# Patient Record
Sex: Male | Born: 1945 | Hispanic: No | Marital: Single
Health system: Southern US, Community
[De-identification: ages and names within clinical notes are randomized; demographics above are authoritative.]

## PROBLEM LIST (undated history)

## (undated) DIAGNOSIS — G8250 Quadriplegia, unspecified: Secondary | ICD-10-CM

## (undated) DIAGNOSIS — J181 Lobar pneumonia, unspecified organism: Secondary | ICD-10-CM

## (undated) DIAGNOSIS — J90 Pleural effusion, not elsewhere classified: Secondary | ICD-10-CM

## (undated) DIAGNOSIS — N186 End stage renal disease: Secondary | ICD-10-CM

## (undated) DIAGNOSIS — J80 Acute respiratory distress syndrome: Secondary | ICD-10-CM

## (undated) DIAGNOSIS — Z992 Dependence on renal dialysis: Secondary | ICD-10-CM

## (undated) DIAGNOSIS — D649 Anemia, unspecified: Secondary | ICD-10-CM

## (undated) DIAGNOSIS — J9621 Acute and chronic respiratory failure with hypoxia: Secondary | ICD-10-CM

## (undated) DIAGNOSIS — G40909 Epilepsy, unspecified, not intractable, without status epilepticus: Secondary | ICD-10-CM

## (undated) HISTORY — PX: OTHER SURGICAL HISTORY: SHX169

## (undated) HISTORY — PX: CERVICAL FUSION: SHX112

---

## 2018-06-06 ENCOUNTER — Inpatient Hospital Stay
Admission: RE | Admit: 2018-06-06 | Discharge: 2018-07-28 | Disposition: E | Payer: Medicare Other | Source: Ambulatory Visit | Attending: Internal Medicine | Admitting: Internal Medicine

## 2018-06-06 ENCOUNTER — Other Ambulatory Visit (HOSPITAL_COMMUNITY): Payer: Medicare Other

## 2018-06-06 DIAGNOSIS — J80 Acute respiratory distress syndrome: Secondary | ICD-10-CM

## 2018-06-06 DIAGNOSIS — Z0189 Encounter for other specified special examinations: Secondary | ICD-10-CM

## 2018-06-06 DIAGNOSIS — J189 Pneumonia, unspecified organism: Secondary | ICD-10-CM

## 2018-06-06 DIAGNOSIS — G8250 Quadriplegia, unspecified: Secondary | ICD-10-CM

## 2018-06-06 DIAGNOSIS — N186 End stage renal disease: Secondary | ICD-10-CM

## 2018-06-06 DIAGNOSIS — Z992 Dependence on renal dialysis: Secondary | ICD-10-CM

## 2018-06-06 DIAGNOSIS — R1310 Dysphagia, unspecified: Secondary | ICD-10-CM

## 2018-06-06 DIAGNOSIS — G40909 Epilepsy, unspecified, not intractable, without status epilepticus: Secondary | ICD-10-CM

## 2018-06-06 DIAGNOSIS — J90 Pleural effusion, not elsewhere classified: Secondary | ICD-10-CM

## 2018-06-06 DIAGNOSIS — R0902 Hypoxemia: Secondary | ICD-10-CM

## 2018-06-06 DIAGNOSIS — Z431 Encounter for attention to gastrostomy: Secondary | ICD-10-CM

## 2018-06-06 DIAGNOSIS — Z452 Encounter for adjustment and management of vascular access device: Secondary | ICD-10-CM

## 2018-06-06 DIAGNOSIS — J9690 Respiratory failure, unspecified, unspecified whether with hypoxia or hypercapnia: Secondary | ICD-10-CM

## 2018-06-06 DIAGNOSIS — R509 Fever, unspecified: Secondary | ICD-10-CM

## 2018-06-06 DIAGNOSIS — Z9889 Other specified postprocedural states: Secondary | ICD-10-CM

## 2018-06-06 DIAGNOSIS — J9621 Acute and chronic respiratory failure with hypoxia: Secondary | ICD-10-CM

## 2018-06-06 HISTORY — DX: End stage renal disease: Z99.2

## 2018-06-06 HISTORY — DX: Anemia, unspecified: D64.9

## 2018-06-06 HISTORY — DX: Acute and chronic respiratory failure with hypoxia: J96.21

## 2018-06-06 HISTORY — DX: Pleural effusion, not elsewhere classified: J90

## 2018-06-06 HISTORY — DX: Lobar pneumonia, unspecified organism: J18.1

## 2018-06-06 HISTORY — DX: Quadriplegia, unspecified: G82.50

## 2018-06-06 HISTORY — DX: End stage renal disease: N18.6

## 2018-06-06 HISTORY — DX: Epilepsy, unspecified, not intractable, without status epilepticus: G40.909

## 2018-06-06 HISTORY — DX: Acute respiratory distress syndrome: J80

## 2018-06-06 LAB — URINALYSIS, ROUTINE W REFLEX MICROSCOPIC
Bilirubin Urine: NEGATIVE
Glucose, UA: 50 mg/dL — AB
Ketones, ur: NEGATIVE mg/dL
Nitrite: NEGATIVE
Protein, ur: 100 mg/dL — AB
RBC / HPF: >50 RBC/hpf — ABNORMAL HIGH (ref 0–5)
SPECIFIC GRAVITY, URINE: 1.02 (ref 1.005–1.030)
pH: 5 (ref 5.0–8.0)

## 2018-06-06 LAB — BLOOD GAS, ARTERIAL
ACID-BASE EXCESS: 3.1 mmol/L — AB (ref 0.0–2.0)
Bicarbonate: 27.8 mmol/L (ref 20.0–28.0)
FIO2: 40
O2 Saturation: 98.8 %
PEEP: 8 cmH2O
PH ART: 7.365 (ref 7.350–7.450)
Patient temperature: 100.6
RATE: 12 resp/min
pCO2 arterial: 50.6 mmHg — ABNORMAL HIGH (ref 32.0–48.0)
pO2, Arterial: 158 mmHg — ABNORMAL HIGH (ref 83.0–108.0)

## 2018-06-06 LAB — C DIFFICILE QUICK SCREEN W PCR REFLEX
C DIFFICILE (CDIFF) INTERP: NOT DETECTED
C DIFFICILE (CDIFF) TOXIN: NEGATIVE
C Diff antigen: NEGATIVE

## 2018-06-07 LAB — COMPREHENSIVE METABOLIC PANEL
ALBUMIN: 3.1 g/dL — AB (ref 3.5–5.0)
ALT: 40 U/L (ref 0–44)
ANION GAP: 19 — AB (ref 5–15)
AST: 44 U/L — AB (ref 15–41)
Alkaline Phosphatase: 112 U/L (ref 38–126)
BUN: 77 mg/dL — AB (ref 8–23)
CHLORIDE: 90 mmol/L — AB (ref 98–111)
CO2: 25 mmol/L (ref 22–32)
Calcium: 8.6 mg/dL — ABNORMAL LOW (ref 8.9–10.3)
Creatinine, Ser: 5.97 mg/dL — ABNORMAL HIGH (ref 0.61–1.24)
GFR calc Af Amer: 10 mL/min — ABNORMAL LOW (ref >60)
GFR, EST NON AFRICAN AMERICAN: 8 mL/min — AB (ref >60)
GLUCOSE: 171 mg/dL — AB (ref 70–99)
POTASSIUM: 3.7 mmol/L (ref 3.5–5.1)
Sodium: 134 mmol/L — ABNORMAL LOW (ref 135–145)
Total Bilirubin: 0.7 mg/dL (ref 0.3–1.2)
Total Protein: 6.2 g/dL — ABNORMAL LOW (ref 6.5–8.1)

## 2018-06-07 LAB — CBC WITH DIFFERENTIAL/PLATELET
ABS IMMATURE GRANULOCYTES: 0.3 10*3/uL — AB (ref 0.0–0.1)
BASOS ABS: 0 10*3/uL (ref 0.0–0.1)
Basophils Relative: 0 %
EOS PCT: 1 %
Eosinophils Absolute: 0.1 10*3/uL (ref 0.0–0.7)
HCT: 25 % — ABNORMAL LOW (ref 39.0–52.0)
Hemoglobin: 8 g/dL — ABNORMAL LOW (ref 13.0–17.0)
Immature Granulocytes: 2 %
Lymphocytes Relative: 10 %
Lymphs Abs: 1.8 10*3/uL (ref 0.7–4.0)
MCH: 30.2 pg (ref 26.0–34.0)
MCHC: 32 g/dL (ref 30.0–36.0)
MCV: 94.3 fL (ref 78.0–100.0)
MONO ABS: 1.9 10*3/uL — AB (ref 0.1–1.0)
Monocytes Relative: 11 %
NEUTROS ABS: 13.6 10*3/uL — AB (ref 1.7–7.7)
Neutrophils Relative %: 76 %
Platelets: 240 10*3/uL (ref 150–400)
RBC: 2.65 MIL/uL — ABNORMAL LOW (ref 4.22–5.81)
RDW: 15 % (ref 11.5–15.5)
WBC: 17.7 10*3/uL — ABNORMAL HIGH (ref 4.0–10.5)

## 2018-06-07 LAB — PHOSPHORUS: Phosphorus: 4.5 mg/dL (ref 2.5–4.6)

## 2018-06-07 LAB — TSH: TSH: 1.753 u[IU]/mL (ref 0.350–4.500)

## 2018-06-07 LAB — PROTIME-INR
INR: 1.11
PROTHROMBIN TIME: 14.2 s (ref 11.4–15.2)

## 2018-06-07 LAB — MAGNESIUM: MAGNESIUM: 2.4 mg/dL (ref 1.7–2.4)

## 2018-06-07 LAB — HEMOGLOBIN A1C
Hgb A1c MFr Bld: 6.7 % — ABNORMAL HIGH (ref 4.8–5.6)
MEAN PLASMA GLUCOSE: 145.59 mg/dL

## 2018-06-07 MED ORDER — IPRATROPIUM BROMIDE HFA 17 MCG/ACT IN AERS
10.00 | INHALATION_SPRAY | RESPIRATORY_TRACT | Status: DC
Start: 2018-06-06 — End: 2018-06-07

## 2018-06-07 MED ORDER — HEPARIN SODIUM (PORCINE) 1000 UNIT/ML IJ SOLN
1600.00 | INTRAMUSCULAR | Status: DC
Start: ? — End: 2018-06-07

## 2018-06-07 MED ORDER — LEVETIRACETAM 500 MG PO TABS
500.00 | ORAL_TABLET | ORAL | Status: DC
Start: 2018-06-07 — End: 2018-06-07

## 2018-06-07 MED ORDER — ALBUTEROL SULFATE HFA 108 (90 BASE) MCG/ACT IN AERS
10.00 | INHALATION_SPRAY | RESPIRATORY_TRACT | Status: DC
Start: 2018-06-06 — End: 2018-06-07

## 2018-06-07 MED ORDER — LEVETIRACETAM 250 MG PO TABS
250.00 | ORAL_TABLET | ORAL | Status: DC
Start: 2018-06-06 — End: 2018-06-07

## 2018-06-07 MED ORDER — FAMOTIDINE 20 MG PO TABS
20.00 | ORAL_TABLET | ORAL | Status: DC
Start: 2018-06-06 — End: 2018-06-07

## 2018-06-07 MED ORDER — GLUCOSE 40 % PO GEL
ORAL | Status: DC
Start: ? — End: 2018-06-07

## 2018-06-07 MED ORDER — ATORVASTATIN CALCIUM 40 MG PO TABS
40.00 | ORAL_TABLET | ORAL | Status: DC
Start: 2018-06-06 — End: 2018-06-07

## 2018-06-07 MED ORDER — OXYCODONE HCL 5 MG PO TABS
5.00 | ORAL_TABLET | ORAL | Status: DC
Start: ? — End: 2018-06-07

## 2018-06-07 MED ORDER — POLYETHYLENE GLYCOL 3350 17 G PO PACK
17.00 | PACK | ORAL | Status: DC
Start: ? — End: 2018-06-07

## 2018-06-07 MED ORDER — ONDANSETRON HCL 4 MG/2ML IJ SOLN
4.00 | INTRAMUSCULAR | Status: DC
Start: ? — End: 2018-06-07

## 2018-06-07 MED ORDER — MIDODRINE HCL 5 MG PO TABS
10.00 | ORAL_TABLET | ORAL | Status: DC
Start: 2018-06-06 — End: 2018-06-07

## 2018-06-07 MED ORDER — ENOXAPARIN SODIUM 30 MG/0.3ML ~~LOC~~ SOLN
30.00 | SUBCUTANEOUS | Status: DC
Start: 2018-06-07 — End: 2018-06-07

## 2018-06-07 MED ORDER — ACETAMINOPHEN 325 MG PO TABS
650.00 | ORAL_TABLET | ORAL | Status: DC
Start: ? — End: 2018-06-07

## 2018-06-07 MED ORDER — DEXTROSE 50 % IV SOLN
25.00 | INTRAVENOUS | Status: DC
Start: ? — End: 2018-06-07

## 2018-06-07 MED ORDER — SEVELAMER CARBONATE 2.4 G PO PACK
2400.00 | PACK | ORAL | Status: DC
Start: 2018-06-06 — End: 2018-06-07

## 2018-06-07 MED ORDER — PROPOFOL 100 MG/10ML IV EMUL
0.00 | INTRAVENOUS | Status: DC
Start: ? — End: 2018-06-07

## 2018-06-07 MED ORDER — HYDRALAZINE HCL 20 MG/ML IJ SOLN
10.00 | INTRAMUSCULAR | Status: DC
Start: ? — End: 2018-06-07

## 2018-06-07 MED ORDER — EPOETIN ALFA 10000 UNIT/ML IJ SOLN
10000.00 | INTRAMUSCULAR | Status: DC
Start: ? — End: 2018-06-07

## 2018-06-07 MED ORDER — GLUCAGON HCL (DIAGNOSTIC) 1 MG IJ SOLR
1.00 | INTRAMUSCULAR | Status: DC
Start: ? — End: 2018-06-07

## 2018-06-07 MED ORDER — INSULIN LISPRO 100 UNIT/ML (KWIKPEN)
0.00 | PEN_INJECTOR | SUBCUTANEOUS | Status: DC
Start: 2018-06-06 — End: 2018-06-07

## 2018-06-07 NOTE — Consult Note (Signed)
[facility] CONSULT NOTE    Date: 06/07/2018                  Patient Name:  Alec Bush.  MRN: [id number]  DOB: 1946-03-26  Age / Sex: 72 y.o., male         PCP: Carron Curie, MD                 Service Requesting Consult: Hospitalist                 Reason for Consult: Evaluation and management of ESRD            History of Present Illness: Patient is a 72 y.o. male with a PMHx of ESRD on HD MWF prior to admission here, seizure disorder, hypertension, anemia chronic kidney disease, secondary hyperparathyroidism, who was admitted to [facility] on 2018-06-24 for ongoing treatment of acute respiratory failure, paraplegia with minimal right arm movement, end-stage renal disease.  Prior to admission at his last hospitalization at the outside hospital patient suffered a fall.  He was found down on the floor.  Ultimately an MRI of the spine was performed and he was found to have an acute cervical disc herniation.  He underwent C3-6 ACDF for cord compression, edema, and weakness.  He had resultant quadriplegia with some very mild ability to move his right upper extremity.  Trial of extubation was performed however he was subsequently reintubated.  He then also developed aspiration pneumonia.  His dialysis was continued during his inpatient hospitalization.  He has a right internal jugular PermCath in place.  Patient currently unable to provide any history at this time.   Medications upon discharge from last hospitalization: acetaminophen 325 MG tablet Commonly known as: TYLENOL Take 2 tablets (650 mg total) by mouth every four (4) hours as needed.  albuterol 90 mcg/actuation inhaler Commonly known as: PROVENTIL HFA;VENTOLIN HFA Inhale 10 puffs every 4 (four) hours.  dextrose 40 % gel Commonly known as: GLUTOSE Take 37.5 g (15 g of dextrose total) by mouth every thirty (30) minutes as needed.  dextrose 50 % in water (D50W) Syrg Infuse 50 mL (25 g total)  into a venous catheter every fifteen (15) minutes as needed.  enoxaparin 30 mg/0.3 mL Syrg Commonly known as: LOVENOX Inject 0.3 mL (30 mg total) under the skin daily. Start taking on: 06/07/2018  famotidine 20 MG tablet Commonly known as: PEPCID 1 tablet (20 mg total) by G-tube route daily.  insulin lispro 100 unit/mL injection pen Commonly known as: HumaLOG Inject 0-15 Units under the skin Every six (6) hours.  ipratropium 17 mcg/actuation inhaler Commonly known as: ATROVENT HFA Inhale 10 puffs every 4 (four) hours.  midodrine 10 MG tablet Commonly known as: PROAMATINE Take 1 tablet (10 mg total) by mouth Three (3) times a day.  ondansetron 4 mg/2 mL injection Commonly known as: ZOFRAN Infuse 2 mL (4 mg total) into a venous catheter every six (6) hours as needed.  oxyCODONE 5 MG immediate release tablet Commonly known as: ROXICODONE Take 1 tablet (5 mg total) by mouth every six (6) hours as needed. for up to 5 days  polyethylene glycol 17 gram packet commonly known as: MIRALAX Take 17 g by mouth daily as needed.  propofol infusion Commonly known as: DIPRIVAN Infuse 0-5,400 mcg/min into a venous catheter continuous.  atorvastatin 40 MG tablet Commonly known as: LIPITOR Take 40 mg by mouth nightly.  levETIRAcetam 250 MG tablet Commonly known as:  KEPPRA Take 500 mg by mouth every morning.  levETIRAcetam 250 MG tablet Commonly known as: KEPPRA Take 250 mg by mouth every evening.  sevelamer carbonate 2.4 gram Pwpk Commonly known as: RENVELA Take 1 packet by mouth Three (3) times a day with a meal.  sodium bicarbonate 650 mg tablet Take 650 mg by mouth Two (2) times a day.   Allergies: No known drug allergies   Past Medical History: ESRD on HD C5-C6 disc herniation status post ACDF Acute respiratory failure Seizure disorder Anemia chronic kidney disease Metabolic acidosis Secondary hyperparathyroidism   Past Surgical History: C5-6 disc herniation  status post ACDF  Family History: Unable to obtain from the patient has is currently on a ventilator.  Social History: Unable to obtain from the patient is currently on the ventilator.  Review of Systems: Unable to obtain from the patient is currently on a ventilator.  Vital Signs: 97.6 pulse 68 respirations 22 blood pressure 159/68  Weight trends: There were no vitals filed for this visit.  Physical Exam: General: Critically ill appearing  Head: Normocephalic, atraumatic.  Eyes: Anicteric  Nose: Mucous membranes moist, not inflammed, nonerythematous.  Throat: ETT in place  Neck: C-collar in place  Lungs:  Scattered rhonchi, vent assisted  Heart: S1S2 no rubs  Abdomen:  BS normoactive. Soft, Nondistended, non-tender.    Extremities: trace pretibial edema.  Neurologic: Currently intubated  Skin: No visible rashes, scars.    Lab results: Basic Metabolic Panel: Recent Labs  Lab 06/07/18 0415  NA 134*  K 3.7  CL 90*  CO2 25  GLUCOSE 171*  BUN 77*  CREATININE 5.97*  CALCIUM 8.6*  MG 2.4  PHOS 4.5    Liver Function Tests: Recent Labs  Lab 06/07/18 0415  AST 44*  ALT 40  ALKPHOS 112  BILITOT 0.7  PROT 6.2*  ALBUMIN 3.1*   No results for input(s): LIPASE, AMYLASE in the last 168 hours. No results for input(s): AMMONIA in the last 168 hours.  CBC: Recent Labs  Lab 06/07/18 0415  WBC 17.7*  NEUTROABS 13.6*  HGB 8.0*  HCT 25.0*  MCV 94.3  PLT 240    Cardiac Enzymes: No results for input(s): CKTOTAL, CKMB, CKMBINDEX, TROPONINI in the last 168 hours.  BNP: Invalid input(s): POCBNP  CBG: No results for input(s): GLUCAP in the last 168 hours.  Microbiology: Results for orders placed or performed during the hospital encounter of October 27, 2018  Culture, respiratory (NON-Expectorated)     Status: None (Preliminary result)   Collection Time: October 27, 2018  4:00 PM  Result Value Ref Range Status   Specimen Description TRACHEAL ASPIRATE  Final   Special  Requests NONE  Final   Gram Stain   Final    MODERATE WBC PRESENT,BOTH PMN AND MONONUCLEAR RARE GRAM POSITIVE COCCI    Culture   Final    CULTURE REINCUBATED FOR BETTER GROWTH Performed at [facility], [address] [place], [place] [address]    Report Status PENDING  Incomplete  C Difficile Quick Screen w PCR reflex     Status: None   Collection Time: October 27, 2018  6:10 PM  Result Value Ref Range Status   C Diff antigen NEGATIVE NEGATIVE Final   C Diff toxin NEGATIVE NEGATIVE Final   C Diff interpretation No C. difficile detected.  Final    Coagulation Studies: Recent Labs    06/07/18 0415  LABPROT 14.2  INR 1.11    Urinalysis: Recent Labs    October 27, 2018 1800  COLORURINE  AMBER*  LABSPEC 1.020  PHURINE 5.0  GLUCOSEU 50*  HGBUR LARGE*  BILIRUBINUR NEGATIVE  KETONESUR NEGATIVE  PROTEINUR 100*  NITRITE NEGATIVE  LEUKOCYTESUR SMALL*      Imaging: Dg Chest Port 1 View  Result Date: July 06, 2018 CLINICAL DATA:  72 year old male with respiratory failure. EXAM: PORTABLE CHEST 1 VIEW COMPARISON:  None. FINDINGS: Portable AP semi upright view at 1558 hours. Endotracheal tube tip projects over the tracheal air column between the level of the clavicles and carina. Enteric tube courses to the abdomen, side hole projects at the gastric body level. There is a tunneled type right IJ approach dual lumen dialysis type catheter in place. Mediastinal contours are within normal limits. The left lung appears clear. There is veiling opacity about the right hilum and lung base. No pneumothorax. Normal pulmonary vascularity. Paucity of bowel gas in the upper abdomen. IMPRESSION: 1. Lines and tubes in place as above. 2. Veiling opacity about the right hilum and lung base could represent a combination of: Atelectasis, pleural effusion, consolidation. Electronically Signed   By: Odessa Fleming M.D.   On: 06-Jul-2018 16:12   Dg Abd Portable 1v  Result Date: 06-Jul-2018 CLINICAL DATA:  Orogastric tube  placement. EXAM: PORTABLE ABDOMEN - 1 VIEW COMPARISON:  None. FINDINGS: The bowel gas pattern is normal. Distal tip of orogastric tube is seen in expected position of proximal stomach. No radio-opaque calculi or other significant radiographic abnormality are seen. IMPRESSION: Distal tip of orogastric tube tip is seen in expected position of proximal stomach. Electronically Signed   By: Lupita Raider, M.D.   On: July 06, 2018 16:12      Assessment & Plan: Pt is a 72 y.o. male with a PMHx of ESRD on HD MWF prior to admission here, seizure disorder, hypertension, anemia chronic kidney disease, secondary hyperparathyroidism, who was admitted to [facility] on 07-06-2018 for ongoing treatment of acute respiratory failure, paraplegia with minimal right arm movement, end-stage renal disease.   1.  ESRD with right internal jugular PermCath.  We will plan to perform hemodialysis tomorrow using his right internal jugular PermCath.  Avoid heparin at this time given recent C-spine surgery.    2. Anemia chronic kidney disease.  Hemoglobin currently 8.0.  Consider adding Epogen to his medication regimen next week.  3.  Secondary hyperparathyroidism.  We will monitor serum calcium, phosphorus, and PTH during the course of this hospitalization.  4.  Acute respiratory failure.  Patient still has endotracheal tube in place.  Tracheostomy will likely be considered this hospital admission.  5.  Thanks for consultation.  Further plan as patient progresses.

## 2018-06-08 LAB — URINE CULTURE: Culture: NO GROWTH

## 2018-06-08 LAB — CBC
HCT: 24.3 % — ABNORMAL LOW (ref 39.0–52.0)
Hemoglobin: 8.1 g/dL — ABNORMAL LOW (ref 13.0–17.0)
MCH: 30.9 pg (ref 26.0–34.0)
MCHC: 33.3 g/dL (ref 30.0–36.0)
MCV: 92.7 fL (ref 78.0–100.0)
Platelets: 248 10*3/uL (ref 150–400)
RBC: 2.62 MIL/uL — ABNORMAL LOW (ref 4.22–5.81)
RDW: 14.9 % (ref 11.5–15.5)
WBC: 14.8 10*3/uL — ABNORMAL HIGH (ref 4.0–10.5)

## 2018-06-08 LAB — RENAL FUNCTION PANEL
ANION GAP: 17 — AB (ref 5–15)
Albumin: 2.9 g/dL — ABNORMAL LOW (ref 3.5–5.0)
BUN: 103 mg/dL — ABNORMAL HIGH (ref 8–23)
CALCIUM: 8.9 mg/dL (ref 8.9–10.3)
CHLORIDE: 88 mmol/L — AB (ref 98–111)
CO2: 26 mmol/L (ref 22–32)
Creatinine, Ser: 6.96 mg/dL — ABNORMAL HIGH (ref 0.61–1.24)
GFR calc non Af Amer: 7 mL/min — ABNORMAL LOW (ref >60)
GFR, EST AFRICAN AMERICAN: 8 mL/min — AB (ref >60)
Glucose, Bld: 197 mg/dL — ABNORMAL HIGH (ref 70–99)
POTASSIUM: 3.7 mmol/L (ref 3.5–5.1)
Phosphorus: 5.5 mg/dL — ABNORMAL HIGH (ref 2.5–4.6)
SODIUM: 131 mmol/L — AB (ref 135–145)

## 2018-06-08 LAB — CULTURE, RESPIRATORY W GRAM STAIN: Culture: NORMAL

## 2018-06-08 LAB — CULTURE, RESPIRATORY

## 2018-06-08 LAB — MAGNESIUM: MAGNESIUM: 2.7 mg/dL — AB (ref 1.7–2.4)

## 2018-06-09 ENCOUNTER — Encounter: Payer: Self-pay | Admitting: Internal Medicine

## 2018-06-09 DIAGNOSIS — G8250 Quadriplegia, unspecified: Secondary | ICD-10-CM | POA: Diagnosis not present

## 2018-06-09 DIAGNOSIS — J9621 Acute and chronic respiratory failure with hypoxia: Secondary | ICD-10-CM

## 2018-06-09 DIAGNOSIS — G40909 Epilepsy, unspecified, not intractable, without status epilepticus: Secondary | ICD-10-CM

## 2018-06-09 DIAGNOSIS — N186 End stage renal disease: Secondary | ICD-10-CM

## 2018-06-09 DIAGNOSIS — Z992 Dependence on renal dialysis: Secondary | ICD-10-CM

## 2018-06-09 HISTORY — DX: End stage renal disease: N18.6

## 2018-06-09 HISTORY — DX: Acute and chronic respiratory failure with hypoxia: J96.21

## 2018-06-09 HISTORY — DX: Epilepsy, unspecified, not intractable, without status epilepticus: G40.909

## 2018-06-09 HISTORY — DX: End stage renal disease: Z99.2

## 2018-06-09 HISTORY — DX: Quadriplegia, unspecified: G82.50

## 2018-06-09 NOTE — Consult Note (Signed)
Pulmonary Critical Care [facility]ULMONARY SERVICE  Date of Service: 06/09/2018  PULMONARY CONSULT   Alec Hammedichard Gangemi Jr.  [id number]  DOB: 11-22-1946   DOA: 12-30-2017  Referring Physician: Carron CurieAli Hijazi, MD  HPI: Alec HammedRichard Polasek Jr. is a 72 y.o. male seen for follow up of Acute on Chronic Respiratory Failure.  Patient has multiple medical problems can presented to our facility for further weaning.  Currently is on pressure support the goal is about 8 hours.  The patient has a history of end-stage renal disease on dialysis.  Has a also a history of seizure disorder hypertension and chronic anemia.  Patient presented to the hospital because of a fall.  Patient was down on the floor and MRI was done which showed a C3 C3-6 cord compression and he underwent anterior cervical fusion for that.  The patient subsequently has been quadriplegic and not really able to move his extremities.  He was extubated at the other facility and failed.  Since the patient is orally intubated he was transferred to our facility for further weaning and management.  Review of Systems:  ROS performed and is unremarkable other than noted above.  Past Medical History:  Diagnosis Date  . Acute on chronic respiratory failure with hypoxia (HCC) 06/09/2018  . Chronic anemia   . End stage renal disease on dialysis (HCC) 06/09/2018  . End stage renal failure on dialysis (HCC)   . Quadriplegia (HCC) 06/09/2018  . Seizure disorder (HCC) 06/09/2018    Past Surgical History:  Procedure Laterality Date  . ACDF    . CERVICAL FUSION      Social History:    has an unknown smoking status. He has never used smokeless tobacco. He reports that he drank alcohol. He reports that he has current or past drug history.  Family History: Non-Contributory to the present illness  Allergies reviewed  Medications: Reviewed on Rounds  Physical Exam:  Vitals: Temperature 97.2 pulse 59 respiratory rate 20  blood pressure 160/73 saturations 100%  Ventilator Settings mode of ventilation pressure support FiO2 28% tidal volume 548 pressure support 12 PEEP 5  . General: Comfortable at this time . Eyes: Grossly normal lids, irises & conjunctiva . ENT: grossly tongue is normal . Neck: no obvious mass . Cardiovascular: S1-S2 normal no gallop or rub . Respiratory: Scattered rhonchi noted bilaterally . Abdomen: Soft nontender . Skin: no rash seen on limited exam . Musculoskeletal: not rigid . Psychiatric:unable to assess . Neurologic: no seizure no involuntary movements         Labs on Admission:  Basic Metabolic Panel: Recent Labs  Lab 06/07/18 0415 06/08/18 0622  NA 134* 131*  K 3.7 3.7  CL 90* 88*  CO2 25 26  GLUCOSE 171* 197*  BUN 77* 103*  CREATININE 5.97* 6.96*  CALCIUM 8.6* 8.9  MG 2.4 2.7*  PHOS 4.5 5.5*    Liver Function Tests: Recent Labs  Lab 06/07/18 0415 06/08/18 0622  AST 44*  --   ALT 40  --   ALKPHOS 112  --   BILITOT 0.7  --   PROT 6.2*  --   ALBUMIN 3.1* 2.9*   No results for input(s): LIPASE, AMYLASE in the last 168 hours. No results for input(s): AMMONIA in the last 168 hours.  CBC: Recent Labs  Lab 06/07/18 0415 06/08/18 0622  WBC 17.7* 14.8*  NEUTROABS 13.6*  --   HGB 8.0* 8.1*  HCT 25.0* 24.3*  MCV 94.3 92.7  PLT  240 248    Cardiac Enzymes: No results for input(s): CKTOTAL, CKMB, CKMBINDEX, TROPONINI in the last 168 hours.  BNP (last 3 results) No results for input(s): BNP in the last 8760 hours.  ProBNP (last 3 results) No results for input(s): PROBNP in the last 8760 hours.   Radiological Exams on Admission: Dg Chest Port 1 View  Result Date: 06/09/2018 CLINICAL DATA:  72 year old male with respiratory failure. EXAM: PORTABLE CHEST 1 VIEW COMPARISON:  None. FINDINGS: Portable AP semi upright view at 1558 hours. Endotracheal tube tip projects over the tracheal air column between the level of the clavicles and carina. Enteric tube  courses to the abdomen, side hole projects at the gastric body level. There is a tunneled type right IJ approach dual lumen dialysis type catheter in place. Mediastinal contours are within normal limits. The left lung appears clear. There is veiling opacity about the right hilum and lung base. No pneumothorax. Normal pulmonary vascularity. Paucity of bowel gas in the upper abdomen. IMPRESSION: 1. Lines and tubes in place as above. 2. Veiling opacity about the right hilum and lung base could represent a combination of: Atelectasis, pleural effusion, consolidation. Electronically Signed   By: Odessa Fleming M.D.   On: 06/04/2018 16:12   Dg Abd Portable 1v  Result Date: 06/13/2018 CLINICAL DATA:  Orogastric tube placement. EXAM: PORTABLE ABDOMEN - 1 VIEW COMPARISON:  None. FINDINGS: The bowel gas pattern is normal. Distal tip of orogastric tube is seen in expected position of proximal stomach. No radio-opaque calculi or other significant radiographic abnormality are seen. IMPRESSION: Distal tip of orogastric tube tip is seen in expected position of proximal stomach. Electronically Signed   By: Lupita Raider, M.D.   On: 06/09/2018 16:12    Assessment/Plan Active Problems:   Acute on chronic respiratory failure with hypoxia (HCC)   End stage renal disease on dialysis (HCC)   Quadriplegia (HCC)   Seizure disorder (HCC)   1. Acute on chronic respiratory failure with hypoxia patient is on a wean protocol weaning currently the goal is for 8 hours on pressure support mode.  So far patient looks like he is doing well.  We will continue with supportive care and follow continue with pulmonary toilet. 2. End-stage renal disease on dialysis.  Followed by nephrology will continue present therapy. 3. Quadriplegia restorative therapy 4. Seizure disorder no active seizures are noted.  I have personally seen and evaluated the patient, evaluated laboratory and imaging results, formulated the assessment and plan and placed  orders. The Patient requires high complexity decision making for assessment and support.  Case was discussed on Rounds with the Respiratory Therapy Staff Time Spent review of the clinical chart as well as discussion with the primary care team and consultants  Yevonne Pax, MD [facility] Pulmonary Critical Care Medicine Sleep Medicine

## 2018-06-10 DIAGNOSIS — G8250 Quadriplegia, unspecified: Secondary | ICD-10-CM | POA: Diagnosis not present

## 2018-06-10 DIAGNOSIS — N186 End stage renal disease: Secondary | ICD-10-CM | POA: Diagnosis not present

## 2018-06-10 DIAGNOSIS — G40909 Epilepsy, unspecified, not intractable, without status epilepticus: Secondary | ICD-10-CM | POA: Diagnosis not present

## 2018-06-10 DIAGNOSIS — J9621 Acute and chronic respiratory failure with hypoxia: Secondary | ICD-10-CM | POA: Diagnosis not present

## 2018-06-10 NOTE — Progress Notes (Signed)
Pulmonary Critical Care [facility]   PULMON[facility] SERVICE  PROGRESS NOTE  Date of Service: 06/10/2018  Carley Hammedichard Bywater Jr.  [id number]  DOB: 04-16-1946   DOA: 2018-07-20  Referring Physician: Carron CurieAli Hijazi, MD  HPI: Carley HammedRichard Moilanen Jr. is a 72 y.o. male seen for follow up of Acute on Chronic Respiratory Failure.  Patient is weaning on pressure support has been tolerating it well.  The goal for today is about 12 hours on pressure support  Medications: Reviewed on Rounds  Physical Exam:  Vitals: Temperature 97.3 pulse 92 respiratory 18 blood pressure 147/67 saturations 92%  Ventilator Settings mode of ventilation pressure support FiO2 28% tidal volume 450 pressure support 12 PEEP 6  . General: Comfortable at this time . Eyes: Grossly normal lids, irises & conjunctiva . ENT: grossly tongue is normal . Neck: no obvious mass . Cardiovascular: S1 S2 normal no gallop . Respiratory: No rhonchi or rales are noted . Abdomen: soft . Skin: no rash seen on limited exam . Musculoskeletal: not rigid . Psychiatric:unable to assess . Neurologic: no seizure no involuntary movements         Lab Data:   Basic Metabolic Panel: Recent Labs  Lab 06/07/18 0415 06/08/18 0622  NA 134* 131*  K 3.7 3.7  CL 90* 88*  CO2 25 26  GLUCOSE 171* 197*  BUN 77* 103*  CREATININE 5.97* 6.96*  CALCIUM 8.6* 8.9  MG 2.4 2.7*  PHOS 4.5 5.5*    Liver Function Tests: Recent Labs  Lab 06/07/18 0415 06/08/18 0622  AST 44*  --   ALT 40  --   ALKPHOS 112  --   BILITOT 0.7  --   PROT 6.2*  --   ALBUMIN 3.1* 2.9*   No results for input(s): LIPASE, AMYLASE in the last 168 hours. No results for input(s): AMMONIA in the last 168 hours.  CBC: Recent Labs  Lab 06/07/18 0415 06/08/18 0622  WBC 17.7* 14.8*  NEUTROABS 13.6*  --   HGB 8.0* 8.1*  HCT 25.0* 24.3*  MCV 94.3 92.7  PLT 240 248    Cardiac Enzymes: No results for input(s): CKTOTAL, CKMB, CKMBINDEX, TROPONINI  in the last 168 hours.  BNP (last 3 results) No results for input(s): BNP in the last 8760 hours.  ProBNP (last 3 results) No results for input(s): PROBNP in the last 8760 hours.  Radiological Exams: No results found.  Assessment/Plan Active Problems:   Acute on chronic respiratory failure with hypoxia (HCC)   End stage renal disease on dialysis (HCC)   Quadriplegia (HCC)   Seizure disorder (HCC)   1. Acute on chronic respiratory failure with hypoxia we will continue weaning on pressure support continue pulmonary toilet secretion management 2. End-stage renal disease on dialysis followed by nephrology will continue with dialysis 3. Quadriplegia grossly unchanged continue with therapy as tolerated 4. Seizure no active seizures are noted at this time.   I have personally seen and evaluated the patient, evaluated laboratory and imaging results, formulated the assessment and plan and placed orders. The Patient requires high complexity decision making for assessment and support.  Case was discussed on Rounds with the Respiratory Therapy Staff  Yevonne PaxSaadat A Zerek Litsey, MD [facility] Pulmonary Critical Care Medicine Sleep Medicine

## 2018-06-10 NOTE — Progress Notes (Signed)
[facility]  ROUNDING NOTE   Subjective:  Patient remains on the ventilator and remains critically ill. Patient will be due for dialysis again tomorrow.   Objective:  Vital signs in last 24 hours:  Temperature 97.3 pulse 82 respirations 18 blood pressure 147/67 Physical Exam: General: Critically ill appearing  Head: ETT in place  Eyes: Anicteric  Neck: C colloar on  Lungs:  Clear to auscultation, vent assisted  Heart: S1S2 no rubs  Abdomen:  Soft, nontender, bowel sounds present  Extremities: Trace peripheral edema.  Neurologic: Intubated, on the vent  Skin: No lesions  Access: R IJ permcath    Basic Metabolic Panel: Recent Labs  Lab 06/07/18 0415 06/08/18 0622  NA 134* 131*  K 3.7 3.7  CL 90* 88*  CO2 25 26  GLUCOSE 171* 197*  BUN 77* 103*  CREATININE 5.97* 6.96*  CALCIUM 8.6* 8.9  MG 2.4 2.7*  PHOS 4.5 5.5*    Liver Function Tests: Recent Labs  Lab 06/07/18 0415 06/08/18 0622  AST 44*  --   ALT 40  --   ALKPHOS 112  --   BILITOT 0.7  --   PROT 6.2*  --   ALBUMIN 3.1* 2.9*   No results for input(s): LIPASE, AMYLASE in the last 168 hours. No results for input(s): AMMONIA in the last 168 hours.  CBC: Recent Labs  Lab 06/07/18 0415 06/08/18 0622  WBC 17.7* 14.8*  NEUTROABS 13.6*  --   HGB 8.0* 8.1*  HCT 25.0* 24.3*  MCV 94.3 92.7  PLT 240 248    Cardiac Enzymes: No results for input(s): CKTOTAL, CKMB, CKMBINDEX, TROPONINI in the last 168 hours.  BNP: Invalid input(s): POCBNP  CBG: No results for input(s): GLUCAP in the last 168 hours.  Microbiology: Results for orders placed or performed during the hospital encounter of 06/19/2018  Culture, respiratory (NON-Expectorated)     Status: None   Collection Time: 06/14/2018  4:00 PM  Result Value Ref Range Status   Specimen Description TRACHEAL ASPIRATE  Final   Special Requests NONE  Final   Gram Stain   Final    MODERATE WBC PRESENT,BOTH PMN AND MONONUCLEAR RARE GRAM POSITIVE  COCCI    Culture   Final    Consistent with normal respiratory flora. Performed at [facility], [address] [place], [place] [address]    Report Status 06/08/2018 FINAL  Final  Culture, Urine     Status: None   Collection Time: 06/03/2018  6:02 PM  Result Value Ref Range Status   Specimen Description URINE, RANDOM  Final   Special Requests NONE  Final   Culture   Final    NO GROWTH Performed at [facility], [address] [place], [place] [address]    Report Status 06/08/2018 FINAL  Final  C Difficile Quick Screen w PCR reflex     Status: None   Collection Time: 06/20/2018  6:10 PM  Result Value Ref Range Status   C Diff antigen NEGATIVE NEGATIVE Final   C Diff toxin NEGATIVE NEGATIVE Final   C Diff interpretation No C. difficile detected.  Final    Coagulation Studies: No results for input(s): LABPROT, INR in the last 72 hours.  Urinalysis: No results for input(s): COLORURINE, LABSPEC, PHURINE, GLUCOSEU, HGBUR, BILIRUBINUR, KETONESUR, PROTEINUR, UROBILINOGEN, NITRITE, LEUKOCYTESUR in the last 72 hours.  Invalid input(s): APPERANCEUR    Imaging: No results found.   Medications:       Assessment/ Plan:  72 y.o. male with a PMHx of ESRD on HD MWF prior to admission here, seizure disorder, hypertension, anemia chronic kidney disease, secondary hyperparathyroidism, who was admitted to [facility] on 06/14/2018 for ongoing treatment of acute respiratory failure, paraplegia with minimal right arm movement, end-stage renal disease.   1.  ESRD with right internal jugular PermCath.    Patient will be due for dialysis again tomorrow.   We will go ahead and prepare orders.  2. Anemia chronic kidney disease.    Hemoglobin 8.1.  Start the patient on retacrit.   3.  Secondary hyperparathyroidism.    Phosphorus 5.5 and acceptable.  Repeat serum phosphorus tomorrow.  4.  Acute respiratory failure.    Continue the patient on ventilatory support.  He will  eventually need a tracheostomy placement.     LOS: 0 Evadne Ose 7/15/20198:50 AM

## 2018-06-11 DIAGNOSIS — G40909 Epilepsy, unspecified, not intractable, without status epilepticus: Secondary | ICD-10-CM | POA: Diagnosis not present

## 2018-06-11 DIAGNOSIS — G8250 Quadriplegia, unspecified: Secondary | ICD-10-CM | POA: Diagnosis not present

## 2018-06-11 DIAGNOSIS — J9621 Acute and chronic respiratory failure with hypoxia: Secondary | ICD-10-CM | POA: Diagnosis not present

## 2018-06-11 DIAGNOSIS — N186 End stage renal disease: Secondary | ICD-10-CM | POA: Diagnosis not present

## 2018-06-11 LAB — CBC
HCT: 22.3 % — ABNORMAL LOW (ref 39.0–52.0)
Hemoglobin: 7.3 g/dL — ABNORMAL LOW (ref 13.0–17.0)
MCH: 30.8 pg (ref 26.0–34.0)
MCHC: 32.7 g/dL (ref 30.0–36.0)
MCV: 94.1 fL (ref 78.0–100.0)
Platelets: 258 10*3/uL (ref 150–400)
RBC: 2.37 MIL/uL — ABNORMAL LOW (ref 4.22–5.81)
RDW: 15.5 % (ref 11.5–15.5)
WBC: 15.6 10*3/uL — AB (ref 4.0–10.5)

## 2018-06-11 LAB — RENAL FUNCTION PANEL
ALBUMIN: 2.8 g/dL — AB (ref 3.5–5.0)
Anion gap: 17 — ABNORMAL HIGH (ref 5–15)
BUN: 121 mg/dL — AB (ref 8–23)
CHLORIDE: 89 mmol/L — AB (ref 98–111)
CO2: 25 mmol/L (ref 22–32)
CREATININE: 7.42 mg/dL — AB (ref 0.61–1.24)
Calcium: 8.5 mg/dL — ABNORMAL LOW (ref 8.9–10.3)
GFR calc Af Amer: 7 mL/min — ABNORMAL LOW (ref >60)
GFR, EST NON AFRICAN AMERICAN: 6 mL/min — AB (ref >60)
GLUCOSE: 101 mg/dL — AB (ref 70–99)
POTASSIUM: 5.5 mmol/L — AB (ref 3.5–5.1)
Phosphorus: 5.7 mg/dL — ABNORMAL HIGH (ref 2.5–4.6)
Sodium: 131 mmol/L — ABNORMAL LOW (ref 135–145)

## 2018-06-11 LAB — HEPATITIS B SURFACE ANTIBODY,QUALITATIVE: Hep B S Ab: NONREACTIVE

## 2018-06-11 LAB — HEPATITIS B SURFACE ANTIGEN: Hepatitis B Surface Ag: NEGATIVE

## 2018-06-11 LAB — POTASSIUM: POTASSIUM: 3.7 mmol/L (ref 3.5–5.1)

## 2018-06-11 LAB — HEPATITIS B CORE ANTIBODY, IGM: HEP B C IGM: NEGATIVE

## 2018-06-11 NOTE — Progress Notes (Signed)
Pulmonary Critical Care Medicine [facility]   PULMONARY SERVICE  PROGRESS NOTE  Date of Service: 06/11/2018  Alec Hammedichard Schnetzer Jr.  WUJ:[id number]  DOB: [birth year]   DOA: 05/30/2018  Referring Physician: Carron CurieAli Hijazi, MD  HPI: Alec HammedRichard Shadix Jr. is a 72 y.o. male seen for follow up of Acute on Chronic Respiratory Failure.  Patient is on pressure support orally intubated.  Was supposed to be starting hemodialysis today appears to be comfortable without distress at this time.  Medications: Reviewed on Rounds  Physical Exam:  Vitals: Temperature 98.7 pulse 71 respiratory rate 18 blood pressure 139/55 saturations 99%  Ventilator Settings mode of ventilation pressure support FiO2 30% tidal volume 378 per support 12 PEEP 5  . General: Comfortable at this time . Eyes: Grossly normal lids, irises & conjunctiva . ENT: grossly tongue is normal . Neck: no obvious mass . Cardiovascular: S1 S2 normal no gallop . Respiratory: No rhonchi or rales are noted at this time . Abdomen: soft . Skin: no rash seen on limited exam . Musculoskeletal: not rigid . Psychiatric:unable to assess . Neurologic: no seizure no involuntary movements         Lab Data:   Basic Metabolic Panel: Recent Labs  Lab 06/07/18 0415 06/08/18 0622 06/11/18 0730  NA 134* 131* 131*  K 3.7 3.7 5.5*  CL 90* 88* 89*  CO2 25 26 25   GLUCOSE 171* 197* 101*  BUN 77* 103* 121*  CREATININE 5.97* 6.96* 7.42*  CALCIUM 8.6* 8.9 8.5*  MG 2.4 2.7*  --   PHOS 4.5 5.5* 5.7*    Liver Function Tests: Recent Labs  Lab 06/07/18 0415 06/08/18 0622 06/11/18 0730  AST 44*  --   --   ALT 40  --   --   ALKPHOS 112  --   --   BILITOT 0.7  --   --   PROT 6.2*  --   --   ALBUMIN 3.1* 2.9* 2.8*   No results for input(s): LIPASE, AMYLASE in the last 168 hours. No results for input(s): AMMONIA in the last 168 hours.  CBC: Recent Labs  Lab 06/07/18 0415 06/08/18 0622 06/11/18 0730  WBC 17.7* 14.8* 15.6*   NEUTROABS 13.6*  --   --   HGB 8.0* 8.1* 7.3*  HCT 25.0* 24.3* 22.3*  MCV 94.3 92.7 94.1  PLT 240 248 258    Cardiac Enzymes: No results for input(s): CKTOTAL, CKMB, CKMBINDEX, TROPONINI in the last 168 hours.  BNP (last 3 results) No results for input(s): BNP in the last 8760 hours.  ProBNP (last 3 results) No results for input(s): PROBNP in the last 8760 hours.  Radiological Exams: No results found.  Assessment/Plan Active Problems:   Acute on chronic respiratory failure with hypoxia (HCC)   End stage renal disease on dialysis (HCC)   Quadriplegia (HCC)   Seizure disorder (HCC)   1. Acute on chronic respiratory failure with hypoxia continue with weaning on pressure support mode continue pulmonary toilet supportive care.  I suspect the patient may end up having to be trach to tolerate this time as tolerated  2. end-stage renal disease on dialysis hemodialysis ordered and started.  We will continue with present management. 3. Quadriplegia continue supportive care 4. Seizure disorder unchanged we will continue with full supportive care   I have personally seen and evaluated the patient, evaluated laboratory and imaging results, formulated the assessment and plan and placed orders. The Patient requires high complexity decision making for assessment and  support.  Case was discussed on Rounds with the Respiratory Therapy Staff  Allyne Gee, MD [facility] Pulmonary Critical Care Medicine Sleep Medicine

## 2018-06-12 DIAGNOSIS — J9621 Acute and chronic respiratory failure with hypoxia: Secondary | ICD-10-CM | POA: Diagnosis not present

## 2018-06-12 DIAGNOSIS — G8250 Quadriplegia, unspecified: Secondary | ICD-10-CM | POA: Diagnosis not present

## 2018-06-12 DIAGNOSIS — N186 End stage renal disease: Secondary | ICD-10-CM | POA: Diagnosis not present

## 2018-06-12 DIAGNOSIS — G40909 Epilepsy, unspecified, not intractable, without status epilepticus: Secondary | ICD-10-CM | POA: Diagnosis not present

## 2018-06-12 LAB — URINALYSIS, ROUTINE W REFLEX MICROSCOPIC
BILIRUBIN URINE: NEGATIVE
GLUCOSE, UA: 50 mg/dL — AB
Ketones, ur: NEGATIVE mg/dL
NITRITE: NEGATIVE
PH: 5 (ref 5.0–8.0)
Protein, ur: >=300 mg/dL — AB
Specific Gravity, Urine: 1.019 (ref 1.005–1.030)

## 2018-06-12 NOTE — Progress Notes (Signed)
Pulmonary Critical Care [facility]   PULMONARY SERVICE  PROGRESS NOTE  Date of Service: 06/12/2018  Carley Hammedichard Winstanley Jr.  WUJ:[id number]  DOB: [birth year]   DOA: 05/30/2018  Referring Physician: Carron CurieAli Hijazi, MD  HPI: Carley HammedRichard Confer Jr. is a 72 y.o. male seen for follow up of Acute on Chronic Respiratory Failure.  Patient is weaning on pressure support the goal is for 20 hours today.  Right now is on 20% oxygen  Medications: Reviewed on Rounds  Physical Exam:  Vitals: Temperature 96.1 pulse 76 respiratory rate 21 blood pressure 165/66 saturations 100%  Ventilator Settings mode of ventilation pressure support FiO2 28% tidal volume 435 pressure support 12 PEEP 5  . General: Comfortable at this time . Eyes: Grossly normal lids, irises & conjunctiva . ENT: grossly tongue is normal . Neck: no obvious mass . Cardiovascular: S1 S2 normal no gallop . Respiratory: No rhonchi or rales . Abdomen: soft . Skin: no rash seen on limited exam . Musculoskeletal: not rigid . Psychiatric:unable to assess . Neurologic: no seizure no involuntary movements         Lab Data:   Basic Metabolic Panel: Recent Labs  Lab 06/07/18 0415 06/08/18 0622 06/11/18 0730 06/11/18 1811  NA 134* 131* 131*  --   K 3.7 3.7 5.5* 3.7  CL 90* 88* 89*  --   CO2 25 26 25   --   GLUCOSE 171* 197* 101*  --   BUN 77* 103* 121*  --   CREATININE 5.97* 6.96* 7.42*  --   CALCIUM 8.6* 8.9 8.5*  --   MG 2.4 2.7*  --   --   PHOS 4.5 5.5* 5.7*  --     Liver Function Tests: Recent Labs  Lab 06/07/18 0415 06/08/18 0622 06/11/18 0730  AST 44*  --   --   ALT 40  --   --   ALKPHOS 112  --   --   BILITOT 0.7  --   --   PROT 6.2*  --   --   ALBUMIN 3.1* 2.9* 2.8*   No results for input(s): LIPASE, AMYLASE in the last 168 hours. No results for input(s): AMMONIA in the last 168 hours.  CBC: Recent Labs  Lab 06/07/18 0415 06/08/18 0622 06/11/18 0730  WBC 17.7* 14.8* 15.6*  NEUTROABS  13.6*  --   --   HGB 8.0* 8.1* 7.3*  HCT 25.0* 24.3* 22.3*  MCV 94.3 92.7 94.1  PLT 240 248 258    Cardiac Enzymes: No results for input(s): CKTOTAL, CKMB, CKMBINDEX, TROPONINI in the last 168 hours.  BNP (last 3 results) No results for input(s): BNP in the last 8760 hours.  ProBNP (last 3 results) No results for input(s): PROBNP in the last 8760 hours.  Radiological Exams: No results found.  Assessment/Plan Active Problems:   Acute on chronic respiratory failure with hypoxia (HCC)   End stage renal disease on dialysis (HCC)   Quadriplegia (HCC)   Seizure disorder (HCC)   1. Acute on chronic respiratory failure with hypoxia patient remains on pressure support plan is to continue with pressure support at this time.  Continue pulmonary toilet supportive care.  The goal is for 20 hours. 2. End-stage renal disease on dialysis followed by nephrology will follow their recommendations. 3. Quadriplegia at baseline we will continue present management. 4. Seizure disorder no active seizures are noted at this time.   I have personally seen and evaluated the patient, evaluated laboratory and imaging results,  formulated the assessment and plan and placed orders. The Patient requires high complexity decision making for assessment and support.  Case was discussed on Rounds with the Respiratory Therapy Staff  Allyne Gee, MD [facility] Pulmonary Critical Care Medicine Sleep Medicine

## 2018-06-12 NOTE — Progress Notes (Signed)
[facility]  ROUNDING NOTE   Subjective:  Patient remains critically ill. Continues dialysis on TTS schedule. Remains on the ventilator.   Objective:  Vital signs in last 24 hours:  Temperature 96 pulse 76 respirations 21 and blood pressure 165/66  Physical Exam: General: Critically ill appearing  Head: ETT in place  Eyes: Anicteric  Neck: C collar on  Lungs:  Clear to auscultation, vent assisted  Heart: S1S2 no rubs  Abdomen:  Soft, nontender, bowel sounds present  Extremities: Trace peripheral edema.  Neurologic: Intubated, on the vent  Skin: No lesions  Access: R IJ permcath    Basic Metabolic Panel: Recent Labs  Lab 06/07/18 0415 06/08/18 0622 06/11/18 0730 06/11/18 1811  NA 134* 131* 131*  --   K 3.7 3.7 5.5* 3.7  CL 90* 88* 89*  --   CO2 25 26 25   --   GLUCOSE 171* 197* 101*  --   BUN 77* 103* 121*  --   CREATININE 5.97* 6.96* 7.42*  --   CALCIUM 8.6* 8.9 8.5*  --   MG 2.4 2.7*  --   --   PHOS 4.5 5.5* 5.7*  --     Liver Function Tests: Recent Labs  Lab 06/07/18 0415 06/08/18 0622 06/11/18 0730  AST 44*  --   --   ALT 40  --   --   ALKPHOS 112  --   --   BILITOT 0.7  --   --   PROT 6.2*  --   --   ALBUMIN 3.1* 2.9* 2.8*   No results for input(s): LIPASE, AMYLASE in the last 168 hours. No results for input(s): AMMONIA in the last 168 hours.  CBC: Recent Labs  Lab 06/07/18 0415 06/08/18 0622 06/11/18 0730  WBC 17.7* 14.8* 15.6*  NEUTROABS 13.6*  --   --   HGB 8.0* 8.1* 7.3*  HCT 25.0* 24.3* 22.3*  MCV 94.3 92.7 94.1  PLT 240 248 258    Cardiac Enzymes: No results for input(s): CKTOTAL, CKMB, CKMBINDEX, TROPONINI in the last 168 hours.  BNP: Invalid input(s): POCBNP  CBG: No results for input(s): GLUCAP in the last 168 hours.  Microbiology: Results for orders placed or performed during the hospital encounter of 06/23/2018  Culture, respiratory (NON-Expectorated)     Status: None   Collection Time: 06/15/2018  4:00 PM   Result Value Ref Range Status   Specimen Description TRACHEAL ASPIRATE  Final   Special Requests NONE  Final   Gram Stain   Final    MODERATE WBC PRESENT,BOTH PMN AND MONONUCLEAR RARE GRAM POSITIVE COCCI    Culture   Final    Consistent with normal respiratory flora. Performed at [facility], [address], [place], [place] [address]    Report Status 06/08/2018 FINAL  Final  Culture, Urine     Status: None   Collection Time: 05/31/2018  6:02 PM  Result Value Ref Range Status   Specimen Description URINE, RANDOM  Final   Special Requests NONE  Final   Culture   Final    NO GROWTH Performed at [facility], [address], [place], [place] [address]    Report Status 06/08/2018 FINAL  Final  C Difficile Quick Screen w PCR reflex     Status: None   Collection Time: 06/12/2018  6:10 PM  Result Value Ref Range Status   C Diff antigen NEGATIVE NEGATIVE Final   C Diff toxin NEGATIVE NEGATIVE Final  C Diff interpretation No C. difficile detected.  Final  Culture, respiratory (NON-Expectorated)     Status: None (Preliminary result)   Collection Time: 06/11/18  1:23 PM  Result Value Ref Range Status   Specimen Description TRACHEAL ASPIRATE  Final   Special Requests NONE  Final   Gram Stain   Final    ABUNDANT WBC PRESENT,BOTH PMN AND MONONUCLEAR ABUNDANT GRAM POSITIVE COCCI IN PAIRS MODERATE BUDDING YEAST SEEN Performed at [facility], [address] [place], [place] [address]    Culture PENDING  Incomplete   Report Status PENDING  Incomplete    Coagulation Studies: No results for input(s): LABPROT, INR in the last 72 hours.  Urinalysis: Recent Labs    06/11/18 1322  COLORURINE AMBER*  LABSPEC 1.019  PHURINE 5.0  GLUCOSEU 50*  HGBUR LARGE*  BILIRUBINUR NEGATIVE  KETONESUR NEGATIVE  PROTEINUR >=300*  NITRITE NEGATIVE  LEUKOCYTESUR TRACE*      Imaging: No results found.   Medications:       Assessment/ Plan:  72 y.o. male with a PMHx  of ESRD on HD MWF prior to admission here, seizure disorder, hypertension, anemia chronic kidney disease, secondary hyperparathyroidism, who was admitted to [facility] on 06/22/2018 for ongoing treatment of acute respiratory failure, paraplegia with minimal right arm movement, end-stage renal disease.   1.  ESRD with right internal jugular PermCath.    Next else is treatment scheduled for tomorrow.  We willprepare orders for the next else is treatment.  Ultrafiltration target 11.5 kg.  2. Anemia chronic kidney disease.   hemoglobin down to 7.3.  Maintain the patient on her to point stimulating agents.  3.  Secondary hyperparathyroidism.    Phosphorus was 5.7 at last check  Repeat serum phosphorus tomorrow.  4.  Acute respiratory failure.    Continue ventilator weaning as per pulmonary/medical care.     LOS: 0 Alec Bush 7/17/201911:17 AM

## 2018-06-13 ENCOUNTER — Other Ambulatory Visit (HOSPITAL_COMMUNITY): Payer: Medicare Other

## 2018-06-13 DIAGNOSIS — G40909 Epilepsy, unspecified, not intractable, without status epilepticus: Secondary | ICD-10-CM | POA: Diagnosis not present

## 2018-06-13 DIAGNOSIS — N186 End stage renal disease: Secondary | ICD-10-CM | POA: Diagnosis not present

## 2018-06-13 DIAGNOSIS — G8250 Quadriplegia, unspecified: Secondary | ICD-10-CM | POA: Diagnosis not present

## 2018-06-13 DIAGNOSIS — J9621 Acute and chronic respiratory failure with hypoxia: Secondary | ICD-10-CM | POA: Diagnosis not present

## 2018-06-13 LAB — RENAL FUNCTION PANEL
Albumin: 2.7 g/dL — ABNORMAL LOW (ref 3.5–5.0)
Anion gap: 16 — ABNORMAL HIGH (ref 5–15)
BUN: 87 mg/dL — AB (ref 8–23)
CHLORIDE: 90 mmol/L — AB (ref 98–111)
CO2: 25 mmol/L (ref 22–32)
Calcium: 8.6 mg/dL — ABNORMAL LOW (ref 8.9–10.3)
Creatinine, Ser: 5.21 mg/dL — ABNORMAL HIGH (ref 0.61–1.24)
GFR calc Af Amer: 11 mL/min — ABNORMAL LOW (ref >60)
GFR calc non Af Amer: 10 mL/min — ABNORMAL LOW (ref >60)
GLUCOSE: 125 mg/dL — AB (ref 70–99)
POTASSIUM: 4.2 mmol/L (ref 3.5–5.1)
Phosphorus: 3.4 mg/dL (ref 2.5–4.6)
Sodium: 131 mmol/L — ABNORMAL LOW (ref 135–145)

## 2018-06-13 LAB — CBC
HCT: 23.1 % — ABNORMAL LOW (ref 39.0–52.0)
Hemoglobin: 7.7 g/dL — ABNORMAL LOW (ref 13.0–17.0)
MCH: 31.6 pg (ref 26.0–34.0)
MCHC: 33.3 g/dL (ref 30.0–36.0)
MCV: 94.7 fL (ref 78.0–100.0)
Platelets: 286 10*3/uL (ref 150–400)
RBC: 2.44 MIL/uL — ABNORMAL LOW (ref 4.22–5.81)
RDW: 14.8 % (ref 11.5–15.5)
WBC: 13.6 10*3/uL — ABNORMAL HIGH (ref 4.0–10.5)

## 2018-06-13 LAB — URINE CULTURE: Culture: NO GROWTH

## 2018-06-13 LAB — CULTURE, RESPIRATORY W GRAM STAIN

## 2018-06-13 LAB — CULTURE, RESPIRATORY: CULTURE: NORMAL

## 2018-06-13 NOTE — Progress Notes (Signed)
Pulmonary Critical Care Medicine [facility] [place]   PULMONARY SERVICE  PROGRESS NOTE  Date of Service: 06/13/2018  Alec Bush.  ZOX:[id number]  DOB: October 12, 1946   DOA: 06/13/18  Referring Physician: Carron Curie, MD  HPI: Alec Bush. is a 72 y.o. male seen for follow up of Acute on Chronic Respiratory Failure.  Currently is on pressure support mode has been on 28% oxygen.  The goal is for 24 hr so far seems to be doing well.  Medications: Reviewed on Rounds  Physical Exam:  Vitals:   Temperature 97.9 degrees pulse 74 respiratory 21 blood pressure 150/71 saturations 95%  Ventilator Settings  Mode of ventilation pressure supportive high O2 28% tidal volume 381 pressure support 12 peep 5  . General: Comfortable at this time . Eyes: Grossly normal lids, irises & conjunctiva . ENT: grossly tongue is normal . Neck: no obvious mass . Cardiovascular: S1 S2 normal no gallop . Respiratory:  No rhonchi or rales are noted at this time. . Abdomen: soft . Skin: no rash seen on limited exam . Musculoskeletal: not rigid . Psychiatric:unable to assess . Neurologic: no seizure no involuntary movements         Lab Data:   Basic Metabolic Panel: Recent Labs  Lab 06/07/18 0415 06/08/18 0622 06/11/18 0730 06/11/18 1811 06/13/18 0525  NA 134* 131* 131*  --  131*  K 3.7 3.7 5.5* 3.7 4.2  CL 90* 88* 89*  --  90*  CO2 25 26 25   --  25  GLUCOSE 171* 197* 101*  --  125*  BUN 77* 103* 121*  --  87*  CREATININE 5.97* 6.96* 7.42*  --  5.21*  CALCIUM 8.6* 8.9 8.5*  --  8.6*  MG 2.4 2.7*  --   --   --   PHOS 4.5 5.5* 5.7*  --  3.4    Liver Function Tests: Recent Labs  Lab 06/07/18 0415 06/08/18 0622 06/11/18 0730 06/13/18 0525  AST 44*  --   --   --   ALT 40  --   --   --   ALKPHOS 112  --   --   --   BILITOT 0.7  --   --   --   PROT 6.2*  --   --   --   ALBUMIN 3.1* 2.9* 2.8* 2.7*   No results for input(s): LIPASE, AMYLASE in the last 168 hours. No  results for input(s): AMMONIA in the last 168 hours.  CBC: Recent Labs  Lab 06/07/18 0415 06/08/18 0622 06/11/18 0730 06/13/18 0525  WBC 17.7* 14.8* 15.6* 13.6*  NEUTROABS 13.6*  --   --   --   HGB 8.0* 8.1* 7.3* 7.7*  HCT 25.0* 24.3* 22.3* 23.1*  MCV 94.3 92.7 94.1 94.7  PLT 240 248 258 286    Cardiac Enzymes: No results for input(s): CKTOTAL, CKMB, CKMBINDEX, TROPONINI in the last 168 hours.  BNP (last 3 results) No results for input(s): BNP in the last 8760 hours.  ProBNP (last 3 results) No results for input(s): PROBNP in the last 8760 hours.  Radiological Exams: Dg Chest Port 1 View  Result Date: 06/13/2018 CLINICAL DATA:  Desaturation EXAM: PORTABLE CHEST 1 VIEW COMPARISON:  06/13/2018 FINDINGS: Endotracheal tube with the tip 3.1 cm above the carina. Nasogastric tube coursing below the diaphragm. Dual lumen right-sided central venous catheter projecting over the SVC. There is a small right pleural effusion. There is interstitial and alveolar airspace opacities throughout the  right lung. The left lung is clear. There is no left pleural effusion. There is no pneumothorax. There is no focal parenchymal opacity. The heart and mediastinal contours are unremarkable. The osseous structures are unremarkable. IMPRESSION: 1. Right pleural effusion with right lung interstitial and alveolar airspace opacities which may reflect pneumonia versus asymmetric pulmonary edema. 2. Support lines and tubing in satisfactory position. Electronically Signed   By: Elige KoHetal  Patel   On: 06/13/2018 11:36    Assessment/Plan Active Problems:   Acute on chronic respiratory failure with hypoxia (HCC)   End stage renal disease on dialysis (HCC)   Quadriplegia (HCC)   Seizure disorder (HCC)   1.   Acute on chronic Respiratory failure with hypoxia continue weaning on pressure support continue with pulmonary toilet with under goal is for 24 hr.   2. End-stage renal disease on dialysis will continue  supportive care 3. Quadriplegia prognosis guarded for weaning.   4. Seizure disorder stable no active seizures are noted. 5. Pleural effusions noted on the chest x-ray along with some interstitial disease.  Will continue with supportive care discuss with primary care regarding management at this time possibly thoracentesis   I have personally seen and evaluated the patient, evaluated laboratory and imaging results, formulated the assessment and plan and placed orders. The Patient requires high complexity decision making for assessment and support.  Case was discussed on Rounds with the Respiratory Therapy Staff  Yevonne PaxSaadat A Khan, MD [facility] Pulmonary Critical Care Medicine Sleep Medicine

## 2018-06-14 DIAGNOSIS — J9621 Acute and chronic respiratory failure with hypoxia: Secondary | ICD-10-CM | POA: Diagnosis not present

## 2018-06-14 DIAGNOSIS — N186 End stage renal disease: Secondary | ICD-10-CM | POA: Diagnosis not present

## 2018-06-14 DIAGNOSIS — G8250 Quadriplegia, unspecified: Secondary | ICD-10-CM | POA: Diagnosis not present

## 2018-06-14 DIAGNOSIS — G40909 Epilepsy, unspecified, not intractable, without status epilepticus: Secondary | ICD-10-CM | POA: Diagnosis not present

## 2018-06-14 NOTE — Progress Notes (Signed)
[facility]  ROUNDING NOTE   Subjective:  Patient seen at bedside. Continues dialysis on Tuesday, Thursday, Saturday. Resting comfortably at the moment.   Objective:  Vital signs in last 24 hours:  Temperature 97.4 pulse 69 respirations 24 blood pressure 139/68  Physical Exam: General: Critically ill appearing  Head: ETT in place  Eyes: Anicteric  Neck: C collar on  Lungs:  Clear to auscultation, vent assisted  Heart: S1S2 no rubs  Abdomen:  Soft, nontender, bowel sounds present  Extremities: Trace peripheral edema.  Neurologic: Intubated, on the vent  Skin: No lesions  Access: R IJ permcath    Basic Metabolic Panel: Recent Labs  Lab 06/08/18 0622 06/11/18 0730 06/11/18 1811 06/13/18 0525  NA 131* 131*  --  131*  K 3.7 5.5* 3.7 4.2  CL 88* 89*  --  90*  CO2 26 25  --  25  GLUCOSE 197* 101*  --  125*  BUN 103* 121*  --  87*  CREATININE 6.96* 7.42*  --  5.21*  CALCIUM 8.9 8.5*  --  8.6*  MG 2.7*  --   --   --   PHOS 5.5* 5.7*  --  3.4    Liver Function Tests: Recent Labs  Lab 06/08/18 0622 06/11/18 0730 06/13/18 0525  ALBUMIN 2.9* 2.8* 2.7*   No results for input(s): LIPASE, AMYLASE in the last 168 hours. No results for input(s): AMMONIA in the last 168 hours.  CBC: Recent Labs  Lab 06/08/18 0622 06/11/18 0730 06/13/18 0525  WBC 14.8* 15.6* 13.6*  HGB 8.1* 7.3* 7.7*  HCT 24.3* 22.3* 23.1*  MCV 92.7 94.1 94.7  PLT 248 258 286    Cardiac Enzymes: No results for input(s): CKTOTAL, CKMB, CKMBINDEX, TROPONINI in the last 168 hours.  BNP: Invalid input(s): POCBNP  CBG: No results for input(s): GLUCAP in the last 168 hours.  Microbiology: Results for orders placed or performed during the hospital encounter of 09-Jun-2018  Culture, respiratory (NON-Expectorated)     Status: None   Collection Time: 06-09-18  4:00 PM  Result Value Ref Range Status   Specimen Description TRACHEAL ASPIRATE  Final   Special Requests NONE  Final   Gram  Stain   Final    MODERATE WBC PRESENT,BOTH PMN AND MONONUCLEAR RARE GRAM POSITIVE COCCI    Culture   Final    Consistent with normal respiratory flora. Performed at [facility], [address], [place], [place] [address]    Report Status 06/08/2018 FINAL  Final  Culture, Urine     Status: None   Collection Time: 06/09/18  6:02 PM  Result Value Ref Range Status   Specimen Description URINE, RANDOM  Final   Special Requests NONE  Final   Culture   Final    NO GROWTH Performed at [facility], [address] [place], [place] [address]    Report Status 06/08/2018 FINAL  Final  C Difficile Quick Screen w PCR reflex     Status: None   Collection Time: 09-Jun-2018  6:10 PM  Result Value Ref Range Status   C Diff antigen NEGATIVE NEGATIVE Final   C Diff toxin NEGATIVE NEGATIVE Final   C Diff interpretation No C. difficile detected.  Final  Culture, respiratory (NON-Expectorated)     Status: None   Collection Time: 06/11/18  1:23 PM  Result Value Ref Range Status   Specimen Description TRACHEAL ASPIRATE  Final   Special Requests NONE  Final  Gram Stain   Final    ABUNDANT WBC PRESENT,BOTH PMN AND MONONUCLEAR ABUNDANT GRAM POSITIVE COCCI IN PAIRS MODERATE BUDDING YEAST SEEN    Culture   Final    Consistent with normal respiratory flora. Performed at [facility], [address] [place], [place] [address]    Report Status 06/13/2018 FINAL  Final  Culture, Urine     Status: None   Collection Time: 06/12/18  6:59 AM  Result Value Ref Range Status   Specimen Description URINE, RANDOM  Final   Special Requests NONE  Final   Culture   Final    NO GROWTH Performed at [facility], [address] [place], [place] [address]    Report Status 06/13/2018 FINAL  Final    Coagulation Studies: No results for input(s): LABPROT, INR in the last 72 hours.  Urinalysis: Recent Labs    06/11/18 1322  COLORURINE AMBER*  LABSPEC 1.019  PHURINE 5.0  GLUCOSEU 50*   HGBUR LARGE*  BILIRUBINUR NEGATIVE  KETONESUR NEGATIVE  PROTEINUR >=300*  NITRITE NEGATIVE  LEUKOCYTESUR TRACE*      Imaging: Dg Chest Port 1 View  Result Date: 06/13/2018 CLINICAL DATA:  Desaturation EXAM: PORTABLE CHEST 1 VIEW COMPARISON:  2018-04-30 FINDINGS: Endotracheal tube with the tip 3.1 cm above the carina. Nasogastric tube coursing below the diaphragm. Dual lumen right-sided central venous catheter projecting over the SVC. There is a small right pleural effusion. There is interstitial and alveolar airspace opacities throughout the right lung. The left lung is clear. There is no left pleural effusion. There is no pneumothorax. There is no focal parenchymal opacity. The heart and mediastinal contours are unremarkable. The osseous structures are unremarkable. IMPRESSION: 1. Right pleural effusion with right lung interstitial and alveolar airspace opacities which may reflect pneumonia versus asymmetric pulmonary edema. 2. Support lines and tubing in satisfactory position. Electronically Signed   By: Elige KoHetal  Patel   On: 06/13/2018 11:36     Medications:       Assessment/ Plan:  72 y.o. male with a PMHx of ESRD on HD MWF prior to admission here, seizure disorder, hypertension, anemia chronic kidney disease, secondary hyperparathyroidism, who was admitted to [facility] on Apr 27, 2018 for ongoing treatment of acute respiratory failure, paraplegia with minimal right arm movement, end-stage renal disease.   1.  ESRD with right internal jugular PermCath.    We will plan for hemodialysis again tomorrow.  Orders to be prepared.  Ultrafiltration target 1 kg.  2. Anemia chronic kidney disease.    Hemoglobin has risen slightly to 7.7.  Continue the patient on erythropoietin stimulating agents.  3.  Secondary hyperparathyroidism.    Phosphorus down to 3.4 with the last dialysis session.  Continue to monitor.  4.  Acute respiratory failure.    Remains on the ventilator.  Tracheostomy  being considered.     LOS: 0 Rafan Sanders 7/19/20198:42 AM

## 2018-06-14 NOTE — Progress Notes (Signed)
[facility]   PULMONARY SERVICE  PROGRESS NOTE  Date of Service: 06/14/2018  Alec Hammedichard Enlow Jr.  ZOX:[id number]  DOB: 1945-12-15   DOA: 06/23/2018  Referring Physician: Carron CurieAli Hijazi, MD  HPI: Alec HammedRichard Horseman Jr. is a 72 y.o. male seen for follow up of Acute on Chronic Respiratory Failure.  Patient is on pressure support mode at this time.  Failed leak test.  Patient will basically be needing to have a tracheostomy done due to multiple failures at extubation.  At this time is on pressure support FiO2 28%  Medications: Reviewed on Rounds  Physical Exam:  Vitals: Temperature 97.4 pulse 69 respiratory 24 blood pressure 139/68 saturations 100%  Ventilator Settings mode of ventilation pressure support FiO2 20% tidal volume 369 per support 12 PEEP 5  . General: Comfortable at this time . Eyes: Grossly normal lids, irises & conjunctiva . ENT: grossly tongue is normal . Neck: no obvious mass . Cardiovascular: S1 S2 normal no gallop . Respiratory: Coarse breath sounds no rhonchi at this time. . Abdomen: soft . Skin: no rash seen on limited exam . Musculoskeletal: not rigid . Psychiatric:unable to assess . Neurologic: no seizure no involuntary movements         Lab Data:   Basic Metabolic Panel: Recent Labs  Lab 06/08/18 0622 06/11/18 0730 06/11/18 1811 06/13/18 0525  NA 131* 131*  --  131*  K 3.7 5.5* 3.7 4.2  CL 88* 89*  --  90*  CO2 26 25  --  25  GLUCOSE 197* 101*  --  125*  BUN 103* 121*  --  87*  CREATININE 6.96* 7.42*  --  5.21*  CALCIUM 8.9 8.5*  --  8.6*  MG 2.7*  --   --   --   PHOS 5.5* 5.7*  --  3.4    Liver Function Tests: Recent Labs  Lab 06/08/18 0622 06/11/18 0730 06/13/18 0525  ALBUMIN 2.9* 2.8* 2.7*   No results for input(s): LIPASE, AMYLASE in the last 168 hours. No results for input(s): AMMONIA in the last 168 hours.  CBC: Recent Labs  Lab 06/08/18 0622 06/11/18 0730 06/13/18 0525  WBC  14.8* 15.6* 13.6*  HGB 8.1* 7.3* 7.7*  HCT 24.3* 22.3* 23.1*  MCV 92.7 94.1 94.7  PLT 248 258 286    Cardiac Enzymes: No results for input(s): CKTOTAL, CKMB, CKMBINDEX, TROPONINI in the last 168 hours.  BNP (last 3 results) No results for input(s): BNP in the last 8760 hours.  ProBNP (last 3 results) No results for input(s): PROBNP in the last 8760 hours.  Radiological Exams: Dg Chest Port 1 View  Result Date: 06/13/2018 CLINICAL DATA:  Desaturation EXAM: PORTABLE CHEST 1 VIEW COMPARISON:  06/15/2018 FINDINGS: Endotracheal tube with the tip 3.1 cm above the carina. Nasogastric tube coursing below the diaphragm. Dual lumen right-sided central venous catheter projecting over the SVC. There is a small right pleural effusion. There is interstitial and alveolar airspace opacities throughout the right lung. The left lung is clear. There is no left pleural effusion. There is no pneumothorax. There is no focal parenchymal opacity. The heart and mediastinal contours are unremarkable. The osseous structures are unremarkable. IMPRESSION: 1. Right pleural effusion with right lung interstitial and alveolar airspace opacities which may reflect pneumonia versus asymmetric pulmonary edema. 2. Support lines and tubing in satisfactory position. Electronically Signed   By: Elige KoHetal  Patel   On: 06/13/2018 11:36    Assessment/Plan Active Problems:   Acute  on chronic respiratory failure with hypoxia (HCC)   End stage renal disease on dialysis (HCC)   Quadriplegia (HCC)   Seizure disorder (HCC)   1. Acute on chronic respiratory failure with hypoxia we will continue on pressure support mode however as mentioned again will need a tracheostomy we will get ENT consultation for trach order was placed today. 2. End-stage renal disease on dialysis followed by nephrology continue with supportive care 3. Quadriplegia at baseline is escorted for weaning 4. Seizure disorder no active seizures noted at this time.   I  have personally seen and evaluated the patient, evaluated laboratory and imaging results, formulated the assessment and plan and placed orders. The Patient requires high complexity decision making for assessment and support.  Case was discussed on Rounds with the Respiratory Therapy Staff  Yevonne Pax, MD [facility] Pulmonary Critical Care Medicine Sleep Medicine

## 2018-06-15 DIAGNOSIS — G40909 Epilepsy, unspecified, not intractable, without status epilepticus: Secondary | ICD-10-CM | POA: Diagnosis not present

## 2018-06-15 DIAGNOSIS — J9621 Acute and chronic respiratory failure with hypoxia: Secondary | ICD-10-CM | POA: Diagnosis not present

## 2018-06-15 DIAGNOSIS — G8250 Quadriplegia, unspecified: Secondary | ICD-10-CM | POA: Diagnosis not present

## 2018-06-15 DIAGNOSIS — N186 End stage renal disease: Secondary | ICD-10-CM | POA: Diagnosis not present

## 2018-06-15 LAB — RENAL FUNCTION PANEL
ALBUMIN: 2.5 g/dL — AB (ref 3.5–5.0)
ANION GAP: 14 (ref 5–15)
BUN: 76 mg/dL — AB (ref 8–23)
CHLORIDE: 89 mmol/L — AB (ref 98–111)
CO2: 25 mmol/L (ref 22–32)
Calcium: 8.5 mg/dL — ABNORMAL LOW (ref 8.9–10.3)
Creatinine, Ser: 4.37 mg/dL — ABNORMAL HIGH (ref 0.61–1.24)
GFR calc Af Amer: 14 mL/min — ABNORMAL LOW (ref >60)
GFR calc non Af Amer: 12 mL/min — ABNORMAL LOW (ref >60)
GLUCOSE: 174 mg/dL — AB (ref 70–99)
POTASSIUM: 4.1 mmol/L (ref 3.5–5.1)
Phosphorus: 2.8 mg/dL (ref 2.5–4.6)
Sodium: 128 mmol/L — ABNORMAL LOW (ref 135–145)

## 2018-06-15 LAB — CBC
HEMATOCRIT: 23.7 % — AB (ref 39.0–52.0)
Hemoglobin: 7.5 g/dL — ABNORMAL LOW (ref 13.0–17.0)
MCH: 30 pg (ref 26.0–34.0)
MCHC: 31.6 g/dL (ref 30.0–36.0)
MCV: 94.8 fL (ref 78.0–100.0)
Platelets: 400 10*3/uL (ref 150–400)
RBC: 2.5 MIL/uL — ABNORMAL LOW (ref 4.22–5.81)
RDW: 15.1 % (ref 11.5–15.5)
WBC: 15.1 10*3/uL — ABNORMAL HIGH (ref 4.0–10.5)

## 2018-06-15 NOTE — Progress Notes (Signed)
[facility]   PULMONARY SERVICE  PROGRESS NOTE  Date of Service: 06/15/2018  Alec Hammedichard Fischl Jr.  WGN:[id number]  DOB: 1946-08-03   DOA: 06/18/2018  Referring Physician: Carron CurieAli Hijazi, MD  HPI: Alec HammedRichard Ganesh Jr. is a 72 y.o. male seen for follow up of Acute on Chronic Respiratory Failure.  Patient is comfortable at this time no distress.  Is on pressure support mode has been on 28% oxygen with a tidal volume of 412 cc  Medications: Reviewed on Rounds  Physical Exam:  Vitals: Temperature 97.3 pulse 66 respiratory rate 27 blood pressure 121/57 saturations 97%  Ventilator Settings mode of ventilation pressure support FiO2 20% tidal volume 412 PEEP 5  . General: Comfortable at this time . Eyes: Grossly normal lids, irises & conjunctiva . ENT: grossly tongue is normal . Neck: no obvious mass . Cardiovascular: S1 S2 normal no gallop . Respiratory: No rhonchi or rales . Abdomen: soft . Skin: no rash seen on limited exam . Musculoskeletal: not rigid . Psychiatric:unable to assess . Neurologic: no seizure no involuntary movements         Lab Data:   Basic Metabolic Panel: Recent Labs  Lab 06/11/18 0730 06/11/18 1811 06/13/18 0525 06/15/18 0701  NA 131*  --  131* 128*  K 5.5* 3.7 4.2 4.1  CL 89*  --  90* 89*  CO2 25  --  25 25  GLUCOSE 101*  --  125* 174*  BUN 121*  --  87* 76*  CREATININE 7.42*  --  5.21* 4.37*  CALCIUM 8.5*  --  8.6* 8.5*  PHOS 5.7*  --  3.4 2.8    Liver Function Tests: Recent Labs  Lab 06/11/18 0730 06/13/18 0525 06/15/18 0701  ALBUMIN 2.8* 2.7* 2.5*   No results for input(s): LIPASE, AMYLASE in the last 168 hours. No results for input(s): AMMONIA in the last 168 hours.  CBC: Recent Labs  Lab 06/11/18 0730 06/13/18 0525 06/15/18 0701  WBC 15.6* 13.6* 15.1*  HGB 7.3* 7.7* 7.5*  HCT 22.3* 23.1* 23.7*  MCV 94.1 94.7 94.8  PLT 258 286 400    Cardiac Enzymes: No results for input(s):  CKTOTAL, CKMB, CKMBINDEX, TROPONINI in the last 168 hours.  BNP (last 3 results) No results for input(s): BNP in the last 8760 hours.  ProBNP (last 3 results) No results for input(s): PROBNP in the last 8760 hours.  Radiological Exams: No results found.  Assessment/Plan Active Problems:   Acute on chronic respiratory failure with hypoxia (HCC)   End stage renal disease on dialysis (HCC)   Quadriplegia (HCC)   Seizure disorder (HCC)   1. Acute on chronic respiratory failure with hypoxia we will continue with pressure support weaning awaiting tracheostomy 2. Quadriplegia at baseline supportive care 3. Seizure disorder no active seizures 4. End-stage renal disease on dialysis followed by nephrology   I have personally seen and evaluated the patient, evaluated laboratory and imaging results, formulated the assessment and plan and placed orders. The Patient requires high complexity decision making for assessment and support.  Case was discussed on Rounds with the Respiratory Therapy Staff  Yevonne PaxSaadat A Jana Swartzlander, MD [facility] Pulmonary Critical Care Medicine Sleep Medicine

## 2018-06-16 ENCOUNTER — Other Ambulatory Visit (HOSPITAL_COMMUNITY): Payer: Medicare Other

## 2018-06-16 DIAGNOSIS — G8250 Quadriplegia, unspecified: Secondary | ICD-10-CM | POA: Diagnosis not present

## 2018-06-16 DIAGNOSIS — N186 End stage renal disease: Secondary | ICD-10-CM | POA: Diagnosis not present

## 2018-06-16 DIAGNOSIS — G40909 Epilepsy, unspecified, not intractable, without status epilepticus: Secondary | ICD-10-CM | POA: Diagnosis not present

## 2018-06-16 DIAGNOSIS — J9621 Acute and chronic respiratory failure with hypoxia: Secondary | ICD-10-CM | POA: Diagnosis not present

## 2018-06-16 LAB — URINALYSIS, ROUTINE W REFLEX MICROSCOPIC
BILIRUBIN URINE: NEGATIVE
GLUCOSE, UA: 50 mg/dL — AB
KETONES UR: NEGATIVE mg/dL
NITRITE: NEGATIVE
PH: 5 (ref 5.0–8.0)
Protein, ur: >=300 mg/dL — AB
RBC / HPF: >50 RBC/hpf — ABNORMAL HIGH (ref 0–5)
Specific Gravity, Urine: 1.022 (ref 1.005–1.030)

## 2018-06-16 NOTE — Progress Notes (Signed)
[facility]ulmonary [facility]   PULMONARY SERVICE  PROGRESS NOTE  Date of Service: 06/16/2018  Carley Hammedichard Kinderman Jr.  WUJ:[id number]  DOB: 1946/10/22   DOA: 06/22/2018  Referring Physician: Carron CurieAli Hijazi, MD  HPI: Carley HammedRichard Eastridge Jr. is a 72 y.o. male seen for follow up of Acute on Chronic Respiratory Failure.  Patient has a low-grade fever remains on the ventilator.  Was tried on pressure support did not tolerate.  ET tube stays in place in good position.  Patient is awaiting to have a tracheostomy done.  Medications: Reviewed on Rounds  Physical Exam:  Vitals: Temperature 100.3 pulse 87 respiratory rate 24 blood pressure 169/78 saturations 99%  Ventilator Settings mode of ventilation assist control FiO2 20% tidal volume 433 PEEP 5  . General: Comfortable at this time . Eyes: Grossly normal lids, irises & conjunctiva . ENT: grossly tongue is normal . Neck: no obvious mass . Cardiovascular: S1 S2 normal no gallop . Respiratory: No rhonchi or rales . Abdomen: soft . Skin: no rash seen on limited exam . Musculoskeletal: not rigid . Psychiatric:unable to assess . Neurologic: no seizure no involuntary movements         Lab Data:   Basic Metabolic Panel: Recent Labs  Lab 06/11/18 0730 06/11/18 1811 06/13/18 0525 06/15/18 0701  NA 131*  --  131* 128*  K 5.5* 3.7 4.2 4.1  CL 89*  --  90* 89*  CO2 25  --  25 25  GLUCOSE 101*  --  125* 174*  BUN 121*  --  87* 76*  CREATININE 7.42*  --  5.21* 4.37*  CALCIUM 8.5*  --  8.6* 8.5*  PHOS 5.7*  --  3.4 2.8    Liver Function Tests: Recent Labs  Lab 06/11/18 0730 06/13/18 0525 06/15/18 0701  ALBUMIN 2.8* 2.7* 2.5*   No results for input(s): LIPASE, AMYLASE in the last 168 hours. No results for input(s): AMMONIA in the last 168 hours.  CBC: Recent Labs  Lab 06/11/18 0730 06/13/18 0525 06/15/18 0701  WBC 15.6* 13.6* 15.1*  HGB 7.3* 7.7* 7.5*  HCT 22.3* 23.1* 23.7*  MCV 94.1 94.7 94.8   PLT 258 286 400    Cardiac Enzymes: No results for input(s): CKTOTAL, CKMB, CKMBINDEX, TROPONINI in the last 168 hours.  BNP (last 3 results) No results for input(s): BNP in the last 8760 hours.  ProBNP (last 3 results) No results for input(s): PROBNP in the last 8760 hours.  Radiological Exams: Dg Chest Port 1 View  Result Date: 06/16/2018 CLINICAL DATA:  Fever EXAM: PORTABLE CHEST 1 VIEW COMPARISON:  06/13/2018 FINDINGS: Endotracheal tube, NG tube, and central venous line unchanged. Stable cardiac silhouette. LEFT lung is clear. RIGHT lung airspace disease slightly improved. Similar RIGHT effusion. IMPRESSION: 1. Stable support apparatus. 2. Improved airspace disease in the RIGHT lower lobe. 3. Persistent RIGHT effusion. Electronically Signed   By: Genevive BiStewart  Edmunds M.D.   On: 06/16/2018 11:55    Assessment/Plan Active Problems:   Acute on chronic respiratory failure with hypoxia (HCC)   End stage renal disease on dialysis (HCC)   Quadriplegia (HCC)   Seizure disorder (HCC)   1. Acute on chronic respiratory failure with hypoxia we will continue with full support on the ventilator at this time ET tube is in place awaiting trach 2. End-stage renal disease on dialysis prognosis guarded 3. Quadriplegia remains unchanged 4. Seizure disorder no active seizures are noted at this time   I have personally seen and  evaluated the patient, evaluated laboratory and imaging results, formulated the assessment and plan and placed orders. The Patient requires high complexity decision making for assessment and support.  Case was discussed on Rounds with the Respiratory Therapy Staff  Allyne Gee, MD [facility] Pulmonary Critical Care Medicine Sleep Medicine

## 2018-06-17 DIAGNOSIS — G8250 Quadriplegia, unspecified: Secondary | ICD-10-CM | POA: Diagnosis not present

## 2018-06-17 DIAGNOSIS — N186 End stage renal disease: Secondary | ICD-10-CM | POA: Diagnosis not present

## 2018-06-17 DIAGNOSIS — J9621 Acute and chronic respiratory failure with hypoxia: Secondary | ICD-10-CM | POA: Diagnosis not present

## 2018-06-17 DIAGNOSIS — G40909 Epilepsy, unspecified, not intractable, without status epilepticus: Secondary | ICD-10-CM | POA: Diagnosis not present

## 2018-06-17 LAB — RENAL FUNCTION PANEL
ANION GAP: 18 — AB (ref 5–15)
Albumin: 2.5 g/dL — ABNORMAL LOW (ref 3.5–5.0)
BUN: 87 mg/dL — ABNORMAL HIGH (ref 8–23)
CO2: 24 mmol/L (ref 22–32)
Calcium: 8.1 mg/dL — ABNORMAL LOW (ref 8.9–10.3)
Chloride: 86 mmol/L — ABNORMAL LOW (ref 98–111)
Creatinine, Ser: 4.64 mg/dL — ABNORMAL HIGH (ref 0.61–1.24)
GFR, EST AFRICAN AMERICAN: 13 mL/min — AB (ref >60)
GFR, EST NON AFRICAN AMERICAN: 11 mL/min — AB (ref >60)
Glucose, Bld: 171 mg/dL — ABNORMAL HIGH (ref 70–99)
Phosphorus: 3.8 mg/dL (ref 2.5–4.6)
Potassium: 5 mmol/L (ref 3.5–5.1)
SODIUM: 128 mmol/L — AB (ref 135–145)

## 2018-06-17 LAB — CBC
HCT: 25.1 % — ABNORMAL LOW (ref 39.0–52.0)
HEMOGLOBIN: 8 g/dL — AB (ref 13.0–17.0)
MCH: 30.3 pg (ref 26.0–34.0)
MCHC: 31.9 g/dL (ref 30.0–36.0)
MCV: 95.1 fL (ref 78.0–100.0)
PLATELETS: 398 10*3/uL (ref 150–400)
RBC: 2.64 MIL/uL — AB (ref 4.22–5.81)
RDW: 15.6 % — ABNORMAL HIGH (ref 11.5–15.5)
WBC: 16.4 10*3/uL — AB (ref 4.0–10.5)

## 2018-06-17 LAB — URINE CULTURE: Culture: NO GROWTH

## 2018-06-17 NOTE — Progress Notes (Signed)
[facility]  ROUNDING NOTE   Subjective:  Pt seen at bedside.  Awake this AM.  Due for HD again tomorrow.   Objective:  Vital signs in last 24 hours:  Temperature 97.8 pulse 80 respirations 18 blood pressure 140/52  Physical Exam: General: Critically ill appearing  Head: ETT/OG in place  Eyes: Anicteric  Neck: C collar on  Lungs:  Clear to auscultation, vent assisted  Heart: S1S2 no rubs  Abdomen:  Soft, nontender, bowel sounds present  Extremities: Trace peripheral edema.  Neurologic: Intubated, on the vent  Skin: No lesions  Access: R IJ permcath    Basic Metabolic Panel: Recent Labs  Lab 06/11/18 0730 06/11/18 1811 06/13/18 0525 06/15/18 0701  NA 131*  --  131* 128*  K 5.5* 3.7 4.2 4.1  CL 89*  --  90* 89*  CO2 25  --  25 25  GLUCOSE 101*  --  125* 174*  BUN 121*  --  87* 76*  CREATININE 7.42*  --  5.21* 4.37*  CALCIUM 8.5*  --  8.6* 8.5*  PHOS 5.7*  --  3.4 2.8    Liver Function Tests: Recent Labs  Lab 06/11/18 0730 06/13/18 0525 06/15/18 0701  ALBUMIN 2.8* 2.7* 2.5*   No results for input(s): LIPASE, AMYLASE in the last 168 hours. No results for input(s): AMMONIA in the last 168 hours.  CBC: Recent Labs  Lab 06/11/18 0730 06/13/18 0525 06/15/18 0701  WBC 15.6* 13.6* 15.1*  HGB 7.3* 7.7* 7.5*  HCT 22.3* 23.1* 23.7*  MCV 94.1 94.7 94.8  PLT 258 286 400    Cardiac Enzymes: No results for input(s): CKTOTAL, CKMB, CKMBINDEX, TROPONINI in the last 168 hours.  BNP: Invalid input(s): POCBNP  CBG: No results for input(s): GLUCAP in the last 168 hours.  Microbiology: Results for orders placed or performed during the hospital encounter of 01-28-2018  Culture, respiratory (NON-Expectorated)     Status: None   Collection Time: 01-28-2018  4:00 PM  Result Value Ref Range Status   Specimen Description TRACHEAL ASPIRATE  Final   Special Requests NONE  Final   Gram Stain   Final    MODERATE WBC PRESENT,BOTH PMN AND MONONUCLEAR RARE  GRAM POSITIVE COCCI    Culture   Final    Consistent with normal respiratory flora. Performed at [facility], [address] [place], [place] [address]    Report Status 06/08/2018 FINAL  Final  Culture, Urine     Status: None   Collection Time: 01-28-2018  6:02 PM  Result Value Ref Range Status   Specimen Description URINE, RANDOM  Final   Special Requests NONE  Final   Culture   Final    NO GROWTH Performed at [facility], [address] [place], [place] [address]    Report Status 06/08/2018 FINAL  Final  C Difficile Quick Screen w PCR reflex     Status: None   Collection Time: 01-28-2018  6:10 PM  Result Value Ref Range Status   C Diff antigen NEGATIVE NEGATIVE Final   C Diff toxin NEGATIVE NEGATIVE Final   C Diff interpretation No C. difficile detected.  Final  Culture, respiratory (NON-Expectorated)     Status: None   Collection Time: 06/11/18  1:23 PM  Result Value Ref Range Status   Specimen Description TRACHEAL ASPIRATE  Final   Special Requests NONE  Final   Gram Stain   Final    ABUNDANT WBC PRESENT,BOTH PMN AND  MONONUCLEAR ABUNDANT GRAM POSITIVE COCCI IN PAIRS MODERATE BUDDING YEAST SEEN    Culture   Final    Consistent with normal respiratory flora. Performed at [facility], [address] [place], [place] [address]    Report Status 06/13/2018 FINAL  Final  Culture, Urine     Status: None   Collection Time: 06/12/18  6:59 AM  Result Value Ref Range Status   Specimen Description URINE, RANDOM  Final   Special Requests NONE  Final   Culture   Final    NO GROWTH Performed at [facility], [address] [place], [place] [address]    Report Status 06/13/2018 FINAL  Final    Coagulation Studies: No results for input(s): LABPROT, INR in the last 72 hours.  Urinalysis: Recent Labs    06/16/18 1134  COLORURINE AMBER*  LABSPEC 1.022  PHURINE 5.0  GLUCOSEU 50*  HGBUR LARGE*  BILIRUBINUR NEGATIVE  KETONESUR NEGATIVE  PROTEINUR  >=300*  NITRITE NEGATIVE  LEUKOCYTESUR MODERATE*      Imaging: Dg Chest Port 1 View  Result Date: 06/16/2018 CLINICAL DATA:  Fever EXAM: PORTABLE CHEST 1 VIEW COMPARISON:  06/13/2018 FINDINGS: Endotracheal tube, NG tube, and central venous line unchanged. Stable cardiac silhouette. LEFT lung is clear. RIGHT lung airspace disease slightly improved. Similar RIGHT effusion. IMPRESSION: 1. Stable support apparatus. 2. Improved airspace disease in the RIGHT lower lobe. 3. Persistent RIGHT effusion. Electronically Signed   By: Genevive Bi M.D.   On: 06/16/2018 11:55     Medications:       Assessment/ Plan:  72 y.o. male with a PMHx of ESRD on HD MWF prior to admission here, seizure disorder, hypertension, anemia chronic kidney disease, secondary hyperparathyroidism, who was admitted to [facility] on 05/28/2018 for ongoing treatment of acute respiratory failure, paraplegia with minimal right arm movement, end-stage renal disease.   1.  ESRD with right internal jugular PermCath.    Patient due for dialysis again tomorrow.  Ultrafiltration target 1.5 kg.  2. Anemia chronic kidney disease.    Hemoglobin currently 7.7.  Continue ESA 10,000 units IV with dialysis.  3.  Secondary hyperparathyroidism.    Phosphorus 2.8 and at target.  Continue to monitor.  4.  Acute respiratory failure.    ENT consultation has been placed.  Still has c-collar on.  Patient remains on the ventilator.    LOS: 0 Danyell Shader 7/22/20198:11 AM

## 2018-06-17 NOTE — Progress Notes (Signed)
Pulmonary [facility]   PULMONARY SERVICE  PROGRESS NOTE  Date of Service: 06/17/2018  Alec Hammedichard Kracht Jr.  AVW:[id number]  DOB: 1946/08/13   DOA: Oct 24, 2018  Referring Physician: Carron CurieAli Hijazi, MD  HPI: Alec HammedRichard Diven Jr. is a 72 y.o. male seen for follow up of Acute on Chronic Respiratory Failure.  Patient is weaning on pressure support the goal is for 24 hours looks good so far  Medications: Reviewed on Rounds  Physical Exam:  Vitals: Temperature 97.8 pulse 80 respiratory rate 18 blood pressure 140/50 saturations 99%  Ventilator Settings mode of ventilation pressure support FiO2 20% tidal volume 450 pressure support 12 PEEP 5  . General: Comfortable at this time . Eyes: Grossly normal lids, irises & conjunctiva . ENT: grossly tongue is normal . Neck: no obvious mass . Cardiovascular: S1 S2 normal no gallop . Respiratory: No rhonchi or rales are noted at this time . Abdomen: soft . Skin: no rash seen on limited exam . Musculoskeletal: not rigid . Psychiatric:unable to assess . Neurologic: no seizure no involuntary movements         Lab Data:   Basic Metabolic Panel: Recent Labs  Lab 06/11/18 0730 06/11/18 1811 06/13/18 0525 06/15/18 0701  NA 131*  --  131* 128*  K 5.5* 3.7 4.2 4.1  CL 89*  --  90* 89*  CO2 25  --  25 25  GLUCOSE 101*  --  125* 174*  BUN 121*  --  87* 76*  CREATININE 7.42*  --  5.21* 4.37*  CALCIUM 8.5*  --  8.6* 8.5*  PHOS 5.7*  --  3.4 2.8    Liver Function Tests: Recent Labs  Lab 06/11/18 0730 06/13/18 0525 06/15/18 0701  ALBUMIN 2.8* 2.7* 2.5*   No results for input(s): LIPASE, AMYLASE in the last 168 hours. No results for input(s): AMMONIA in the last 168 hours.  CBC: Recent Labs  Lab 06/11/18 0730 06/13/18 0525 06/15/18 0701 06/17/18 0748  WBC 15.6* 13.6* 15.1* 16.4*  HGB 7.3* 7.7* 7.5* 8.0*  HCT 22.3* 23.1* 23.7* 25.1*  MCV 94.1 94.7 94.8 95.1  PLT 258 286 400 398    Cardiac  Enzymes: No results for input(s): CKTOTAL, CKMB, CKMBINDEX, TROPONINI in the last 168 hours.  BNP (last 3 results) No results for input(s): BNP in the last 8760 hours.  ProBNP (last 3 results) No results for input(s): PROBNP in the last 8760 hours.  Radiological Exams: Dg Chest Port 1 View  Result Date: 06/16/2018 CLINICAL DATA:  Fever EXAM: PORTABLE CHEST 1 VIEW COMPARISON:  06/13/2018 FINDINGS: Endotracheal tube, NG tube, and central venous line unchanged. Stable cardiac silhouette. LEFT lung is clear. RIGHT lung airspace disease slightly improved. Similar RIGHT effusion. IMPRESSION: 1. Stable support apparatus. 2. Improved airspace disease in the RIGHT lower lobe. 3. Persistent RIGHT effusion. Electronically Signed   By: Genevive BiStewart  Edmunds M.D.   On: 06/16/2018 11:55    Assessment/Plan Active Problems:   Acute on chronic respiratory failure with hypoxia (HCC)   End stage renal disease on dialysis (HCC)   Quadriplegia (HCC)   Seizure disorder (HCC)   1. Acute on chronic respiratory failure with hypoxia continue weaning on pressure support as noted the goal is for 24 hours 2. End-stage renal disease on dialysis followed by nephrology will follow recommendations. 3. Quadriplegia grossly unchanged we will continue present management 4. Seizure disorder at baseline we will continue to follow   I have personally seen and evaluated the patient,  evaluated laboratory and imaging results, formulated the assessment and plan and placed orders. The Patient requires high complexity decision making for assessment and support.  Case was discussed on Rounds with the Respiratory Therapy Staff  Allyne Gee, MD [facility] Pulmonary Critical Care Medicine Sleep Medicine

## 2018-06-18 DIAGNOSIS — G40909 Epilepsy, unspecified, not intractable, without status epilepticus: Secondary | ICD-10-CM | POA: Diagnosis not present

## 2018-06-18 DIAGNOSIS — G8250 Quadriplegia, unspecified: Secondary | ICD-10-CM | POA: Diagnosis not present

## 2018-06-18 DIAGNOSIS — J9621 Acute and chronic respiratory failure with hypoxia: Secondary | ICD-10-CM | POA: Diagnosis not present

## 2018-06-18 DIAGNOSIS — N186 End stage renal disease: Secondary | ICD-10-CM | POA: Diagnosis not present

## 2018-06-18 LAB — RENAL FUNCTION PANEL
ANION GAP: 16 — AB (ref 5–15)
Albumin: 2.3 g/dL — ABNORMAL LOW (ref 3.5–5.0)
BUN: 99 mg/dL — ABNORMAL HIGH (ref 8–23)
CHLORIDE: 85 mmol/L — AB (ref 98–111)
CO2: 26 mmol/L (ref 22–32)
Calcium: 8.1 mg/dL — ABNORMAL LOW (ref 8.9–10.3)
Creatinine, Ser: 5.13 mg/dL — ABNORMAL HIGH (ref 0.61–1.24)
GFR calc non Af Amer: 10 mL/min — ABNORMAL LOW (ref >60)
GFR, EST AFRICAN AMERICAN: 12 mL/min — AB (ref >60)
Glucose, Bld: 170 mg/dL — ABNORMAL HIGH (ref 70–99)
POTASSIUM: 4.7 mmol/L (ref 3.5–5.1)
Phosphorus: 4.1 mg/dL (ref 2.5–4.6)
Sodium: 127 mmol/L — ABNORMAL LOW (ref 135–145)

## 2018-06-18 LAB — CBC
HCT: 21.7 % — ABNORMAL LOW (ref 39.0–52.0)
Hemoglobin: 7 g/dL — ABNORMAL LOW (ref 13.0–17.0)
MCH: 30.3 pg (ref 26.0–34.0)
MCHC: 32.3 g/dL (ref 30.0–36.0)
MCV: 93.9 fL (ref 78.0–100.0)
Platelets: 393 10*3/uL (ref 150–400)
RBC: 2.31 MIL/uL — AB (ref 4.22–5.81)
RDW: 15.4 % (ref 11.5–15.5)
WBC: 14.6 10*3/uL — ABNORMAL HIGH (ref 4.0–10.5)

## 2018-06-18 LAB — OCCULT BLOOD X 1 CARD TO LAB, STOOL: FECAL OCCULT BLD: POSITIVE — AB

## 2018-06-18 LAB — VANCOMYCIN, TROUGH: Vancomycin Tr: 9 ug/mL — ABNORMAL LOW (ref 15–20)

## 2018-06-18 NOTE — Progress Notes (Signed)
[facility]   PULMONARY SERVICE  PROGRESS NOTE  Date of Service: 06/18/2018  Alec Hammedichard Paz Jr.  ZOX:[id number]  DOB: [birth year]   DOA: 08/10/2018  Referring Physician: Carron CurieAli Hijazi, MD  HPI: Alec HammedRichard Matar Jr. is a 72 y.o. male seen for follow up of Acute on Chronic Respiratory Failure.  Currently is on assist control mode comfortable without distress.  Patient is on PEEP 5  Medications: Reviewed on Rounds  Physical Exam:  Vitals: Temperature 97.4 pulse 82 respiratory rate 20 blood pressure 116/53 saturations 94%  Ventilator Settings mode of ventilation assist control FiO2 28% tidal volume 464 PEEP 5  . General: Comfortable at this time . Eyes: Grossly normal lids, irises & conjunctiva . ENT: grossly tongue is normal . Neck: no obvious mass . Cardiovascular: S1 S2 normal no gallop . Respiratory: No rhonchi or rales are noted at this time . Abdomen: soft . Skin: no rash seen on limited exam . Musculoskeletal: not rigid . Psychiatric:unable to assess . Neurologic: no seizure no involuntary movements         Lab Data:   Basic Metabolic Panel: Recent Labs  Lab 06/11/18 1811 06/13/18 0525 06/15/18 0701 06/17/18 1213  NA  --  131* 128* 128*  K 3.7 4.2 4.1 5.0  CL  --  90* 89* 86*  CO2  --  25 25 24   GLUCOSE  --  125* 174* 171*  BUN  --  87* 76* 87*  CREATININE  --  5.21* 4.37* 4.64*  CALCIUM  --  8.6* 8.5* 8.1*  PHOS  --  3.4 2.8 3.8    Liver Function Tests: Recent Labs  Lab 06/13/18 0525 06/15/18 0701 06/17/18 1213  ALBUMIN 2.7* 2.5* 2.5*   No results for input(s): LIPASE, AMYLASE in the last 168 hours. No results for input(s): AMMONIA in the last 168 hours.  CBC: Recent Labs  Lab 06/13/18 0525 06/15/18 0701 06/17/18 0748  WBC 13.6* 15.1* 16.4*  HGB 7.7* 7.5* 8.0*  HCT 23.1* 23.7* 25.1*  MCV 94.7 94.8 95.1  PLT 286 400 398    Cardiac Enzymes: No results for input(s): CKTOTAL, CKMB, CKMBINDEX,  TROPONINI in the last 168 hours.  BNP (last 3 results) No results for input(s): BNP in the last 8760 hours.  ProBNP (last 3 results) No results for input(s): PROBNP in the last 8760 hours.  Radiological Exams: Dg Chest Port 1 View  Result Date: 06/16/2018 CLINICAL DATA:  Fever EXAM: PORTABLE CHEST 1 VIEW COMPARISON:  06/13/2018 FINDINGS: Endotracheal tube, NG tube, and central venous line unchanged. Stable cardiac silhouette. LEFT lung is clear. RIGHT lung airspace disease slightly improved. Similar RIGHT effusion. IMPRESSION: 1. Stable support apparatus. 2. Improved airspace disease in the RIGHT lower lobe. 3. Persistent RIGHT effusion. Electronically Signed   By: Genevive BiStewart  Edmunds M.D.   On: 06/16/2018 11:55    Assessment/Plan Active Problems:   Acute on chronic respiratory failure with hypoxia (HCC)   End stage renal disease on dialysis (HCC)   Quadriplegia (HCC)   Seizure disorder (HCC)   1. Acute on chronic respiratory failure with hypoxia we will continue with full vent support.  Patient has the endotracheal tube in place still patient was seen by ENT for tracheostomy. 2. End-stage renal disease patient is being dialyzed today followed by nephrology continue supportive care 3. Quadriplegia unchanged 4. Seizure disorder no active seizures are noted at this time.   I have personally seen and evaluated the patient, evaluated laboratory and  imaging results, formulated the assessment and plan and placed orders. The Patient requires high complexity decision making for assessment and support.  Case was discussed on Rounds with the Respiratory Therapy Staff  Allyne Gee, MD [facility] Pulmonary Critical Care Medicine Sleep Medicine

## 2018-06-19 DIAGNOSIS — N186 End stage renal disease: Secondary | ICD-10-CM | POA: Diagnosis not present

## 2018-06-19 DIAGNOSIS — G8250 Quadriplegia, unspecified: Secondary | ICD-10-CM | POA: Diagnosis not present

## 2018-06-19 DIAGNOSIS — G40909 Epilepsy, unspecified, not intractable, without status epilepticus: Secondary | ICD-10-CM | POA: Diagnosis not present

## 2018-06-19 DIAGNOSIS — J9621 Acute and chronic respiratory failure with hypoxia: Secondary | ICD-10-CM | POA: Diagnosis not present

## 2018-06-19 LAB — CBC
HCT: 22.1 % — ABNORMAL LOW (ref 39.0–52.0)
Hemoglobin: 7.1 g/dL — ABNORMAL LOW (ref 13.0–17.0)
MCH: 30.5 pg (ref 26.0–34.0)
MCHC: 32.1 g/dL (ref 30.0–36.0)
MCV: 94.8 fL (ref 78.0–100.0)
PLATELETS: 348 10*3/uL (ref 150–400)
RBC: 2.33 MIL/uL — ABNORMAL LOW (ref 4.22–5.81)
RDW: 15.9 % — ABNORMAL HIGH (ref 11.5–15.5)
WBC: 14.1 10*3/uL — ABNORMAL HIGH (ref 4.0–10.5)

## 2018-06-19 NOTE — Progress Notes (Signed)
[facility]   PULMONARY SERVICE  PROGRESS NOTE  Date of Service: 06/19/2018  Alec Hammedichard Schroepfer Jr.  ZOX:[id number]  DOB: Dec 30, 1945   DOA: 05/30/2018  Referring Physician: Carron CurieAli Hijazi, MD  HPI: Alec HammedRichard Seymore Jr. is a 72 y.o. male seen for follow up of Acute on Chronic Respiratory Failure.  Patient is on full vent support at this time.  ET tube is in place has not been tolerating weaning.  Awaiting tracheostomy.  Medications: Reviewed on Rounds  Physical Exam:  Vitals: Temperature 97.2 pulse 77 respiratory rate 23 blood pressure 114/54 saturations 98%  Ventilator Settings currently on assist control FiO2 40% tidal volume 450 PEEP 5  . General: Comfortable at this time . Eyes: Grossly normal lids, irises & conjunctiva . ENT: grossly tongue is normal . Neck: no obvious mass . Cardiovascular: S1 S2 normal no gallop . Respiratory: No rhonchi or rales . Abdomen: soft . Skin: no rash seen on limited exam . Musculoskeletal: not rigid . Psychiatric:unable to assess . Neurologic: no seizure no involuntary movements         Lab Data:   Basic Metabolic Panel: Recent Labs  Lab 06/13/18 0525 06/15/18 0701 06/17/18 1213 06/18/18 0733  NA 131* 128* 128* 127*  K 4.2 4.1 5.0 4.7  CL 90* 89* 86* 85*  CO2 25 25 24 26   GLUCOSE 125* 174* 171* 170*  BUN 87* 76* 87* 99*  CREATININE 5.21* 4.37* 4.64* 5.13*  CALCIUM 8.6* 8.5* 8.1* 8.1*  PHOS 3.4 2.8 3.8 4.1    Liver Function Tests: Recent Labs  Lab 06/13/18 0525 06/15/18 0701 06/17/18 1213 06/18/18 0733  ALBUMIN 2.7* 2.5* 2.5* 2.3*   No results for input(s): LIPASE, AMYLASE in the last 168 hours. No results for input(s): AMMONIA in the last 168 hours.  CBC: Recent Labs  Lab 06/13/18 0525 06/15/18 0701 06/17/18 0748 06/18/18 0733 06/19/18 0506  WBC 13.6* 15.1* 16.4* 14.6* 14.1*  HGB 7.7* 7.5* 8.0* 7.0* 7.1*  HCT 23.1* 23.7* 25.1* 21.7* 22.1*  MCV 94.7 94.8 95.1 93.9  94.8  PLT 286 400 398 393 348    Cardiac Enzymes: No results for input(s): CKTOTAL, CKMB, CKMBINDEX, TROPONINI in the last 168 hours.  BNP (last 3 results) No results for input(s): BNP in the last 8760 hours.  ProBNP (last 3 results) No results for input(s): PROBNP in the last 8760 hours.  Radiological Exams: No results found.  Assessment/Plan Active Problems:   Acute on chronic respiratory failure with hypoxia (HCC)   End stage renal disease on dialysis (HCC)   Quadriplegia (HCC)   Seizure disorder (HCC)   1. Acute on chronic respiratory failure with hypoxia await tracheostomy we will continue secretion management pulmonary toilet overall prognosis remains guarded. 2. End-stage renal disease followed by nephrology continue with dialysis 3. Quadriplegia no changes noted 4. Seizure disorder no active seizures   I have personally seen and evaluated the patient, evaluated laboratory and imaging results, formulated the assessment and plan and placed orders. The Patient requires high complexity decision making for assessment and support.  Case was discussed on Rounds with the Respiratory Therapy Staff  Yevonne PaxSaadat A Brantlee Hinde, MD [facility] Pulmonary Critical Care Medicine Sleep Medicine

## 2018-06-19 NOTE — Progress Notes (Signed)
[facility]  ROUNDING NOTE   Subjective:  Patient remains critically ill. Still on the ventilator. Continues to receive tube feeds.  Objective:  Vital signs in last 24 hours:  Temperature 97.2 pulse 77 respirations 23 blood pressure 114/54  Physical Exam: General: Critically ill appearing  Head: ETT/OG in place  Eyes: Anicteric  Neck: C collar on  Lungs:  Clear to auscultation, vent assisted  Heart: S1S2 no rubs  Abdomen:  Soft, nontender, bowel sounds present  Extremities: 1+ peripheral edema.  Neurologic: Intubated, on the vent  Skin: No lesions  Access: R IJ permcath    Basic Metabolic Panel: Recent Labs  Lab 06/13/18 0525 06/15/18 0701 06/17/18 1213 06/18/18 0733  NA 131* 128* 128* 127*  K 4.2 4.1 5.0 4.7  CL 90* 89* 86* 85*  CO2 25 25 24 26   GLUCOSE 125* 174* 171* 170*  BUN 87* 76* 87* 99*  CREATININE 5.21* 4.37* 4.64* 5.13*  CALCIUM 8.6* 8.5* 8.1* 8.1*  PHOS 3.4 2.8 3.8 4.1    Liver Function Tests: Recent Labs  Lab 06/13/18 0525 06/15/18 0701 06/17/18 1213 06/18/18 0733  ALBUMIN 2.7* 2.5* 2.5* 2.3*   No results for input(s): LIPASE, AMYLASE in the last 168 hours. No results for input(s): AMMONIA in the last 168 hours.  CBC: Recent Labs  Lab 06/13/18 0525 06/15/18 0701 06/17/18 0748 06/18/18 0733 06/19/18 0506  WBC 13.6* 15.1* 16.4* 14.6* 14.1*  HGB 7.7* 7.5* 8.0* 7.0* 7.1*  HCT 23.1* 23.7* 25.1* 21.7* 22.1*  MCV 94.7 94.8 95.1 93.9 94.8  PLT 286 400 398 393 348    Cardiac Enzymes: No results for input(s): CKTOTAL, CKMB, CKMBINDEX, TROPONINI in the last 168 hours.  BNP: Invalid input(s): POCBNP  CBG: No results for input(s): GLUCAP in the last 168 hours.  Microbiology: Results for orders placed or performed during the hospital encounter of 2018/06/07  Culture, respiratory (NON-Expectorated)     Status: None   Collection Time: 2018-06-07  4:00 PM  Result Value Ref Range Status   Specimen Description TRACHEAL ASPIRATE   Final   Special Requests NONE  Final   Gram Stain   Final    MODERATE WBC PRESENT,BOTH PMN AND MONONUCLEAR RARE GRAM POSITIVE COCCI    Culture   Final    Consistent with normal respiratory flora. Performed at [facility], [address], [place], [place] [address]    Report Status 06/08/2018 FINAL  Final  Culture, Urine     Status: None   Collection Time: 06/07/18  6:02 PM  Result Value Ref Range Status   Specimen Description URINE, RANDOM  Final   Special Requests NONE  Final   Culture   Final    NO GROWTH Performed at [facility], [address], [place], [place] [address]    Report Status 06/08/2018 FINAL  Final  C Difficile Quick Screen w PCR reflex     Status: None   Collection Time: 06/07/18  6:10 PM  Result Value Ref Range Status   C Diff antigen NEGATIVE NEGATIVE Final   C Diff toxin NEGATIVE NEGATIVE Final   C Diff interpretation No C. difficile detected.  Final  Culture, respiratory (NON-Expectorated)     Status: None   Collection Time: 06/11/18  1:23 PM  Result Value Ref Range Status   Specimen Description TRACHEAL ASPIRATE  Final   Special Requests NONE  Final   Gram Stain   Final    ABUNDANT WBC PRESENT,BOTH PMN AND MONONUCLEAR  ABUNDANT GRAM POSITIVE COCCI IN PAIRS MODERATE BUDDING YEAST SEEN    Culture   Final    Consistent with normal respiratory flora. Performed at [facility], [address] [place], [place] [address]    Report Status 06/13/2018 FINAL  Final  Culture, Urine     Status: None   Collection Time: 06/12/18  6:59 AM  Result Value Ref Range Status   Specimen Description URINE, RANDOM  Final   Special Requests NONE  Final   Culture   Final    NO GROWTH Performed at [facility], [address] [place], [place] [address]    Report Status 06/13/2018 FINAL  Final  Culture, Urine     Status: None   Collection Time: 06/16/18 10:35 AM  Result Value Ref Range Status   Specimen Description URINE, RANDOM  Final    Special Requests NONE  Final   Culture   Final    NO GROWTH Performed at [facility], [address] [place], [place] [address]    Report Status 06/17/2018 FINAL  Final  Culture, blood (routine x 2)     Status: None (Preliminary result)   Collection Time: 06/16/18 10:39 AM  Result Value Ref Range Status   Specimen Description BLOOD RIGHT ANTECUBITAL  Final   Special Requests   Final    BOTTLES DRAWN AEROBIC ONLY Blood Culture adequate volume   Culture   Final    NO GROWTH 2 DAYS Performed at [facility], [address] [place], [place] [address]    Report Status PENDING  Incomplete  Culture, blood (routine x 2)     Status: None (Preliminary result)   Collection Time: 06/16/18 10:39 AM  Result Value Ref Range Status   Specimen Description BLOOD RIGHT ANTECUBITAL  Final   Special Requests   Final    BOTTLES DRAWN AEROBIC ONLY Blood Culture adequate volume   Culture   Final    NO GROWTH 2 DAYS Performed at [facility], [address] [place], [place] [address]    Report Status PENDING  Incomplete    Coagulation Studies: No results for input(s): LABPROT, INR in the last 72 hours.  Urinalysis: Recent Labs    06/16/18 1134  COLORURINE AMBER*  LABSPEC 1.022  PHURINE 5.0  GLUCOSEU 50*  HGBUR LARGE*  BILIRUBINUR NEGATIVE  KETONESUR NEGATIVE  PROTEINUR >=300*  NITRITE NEGATIVE  LEUKOCYTESUR MODERATE*      Imaging: No results found.   Medications:       Assessment/ Plan:  72 y.o. male with a PMHx of ESRD on HD MWF prior to admission here, seizure disorder, hypertension, anemia chronic kidney disease, secondary hyperparathyroidism, who was admitted to [facility] on 08/22/18 for ongoing treatment of acute respiratory failure, paraplegia with minimal right arm movement, end-stage renal disease.   1.  ESRD with right internal jugular PermCath.    We will prepare dialysis orders for Thursday.  Ultrafiltration target 2 kg.  2. Anemia  chronic kidney disease.    Hemoglobin down to 7.1.  Consider blood transfusion but defer to hospitalist.  Continue retacrit [id number] units IV with HD.   3.  Secondary hyperparathyroidism.    Phosphorus acceptable at 4.1.  Repeat tomorrow.  4.  Acute respiratory failure.    Patient remains on the ventilator.  Unclear as to whether he is a candidate for tracheostomy.    LOS: 0 Mariam Helbert 7/24/20198:46 AM

## 2018-06-20 DIAGNOSIS — G40909 Epilepsy, unspecified, not intractable, without status epilepticus: Secondary | ICD-10-CM | POA: Diagnosis not present

## 2018-06-20 DIAGNOSIS — J9621 Acute and chronic respiratory failure with hypoxia: Secondary | ICD-10-CM | POA: Diagnosis not present

## 2018-06-20 DIAGNOSIS — N186 End stage renal disease: Secondary | ICD-10-CM | POA: Diagnosis not present

## 2018-06-20 DIAGNOSIS — G8250 Quadriplegia, unspecified: Secondary | ICD-10-CM | POA: Diagnosis not present

## 2018-06-20 LAB — ABO/RH: ABO/RH(D): B POS

## 2018-06-20 LAB — PREPARE RBC (CROSSMATCH)

## 2018-06-20 LAB — RENAL FUNCTION PANEL
Albumin: 2.2 g/dL — ABNORMAL LOW (ref 3.5–5.0)
Anion gap: 14 (ref 5–15)
BUN: 83 mg/dL — ABNORMAL HIGH (ref 8–23)
CO2: 27 mmol/L (ref 22–32)
Calcium: 8.1 mg/dL — ABNORMAL LOW (ref 8.9–10.3)
Chloride: 91 mmol/L — ABNORMAL LOW (ref 98–111)
Creatinine, Ser: 5.06 mg/dL — ABNORMAL HIGH (ref 0.61–1.24)
GFR calc Af Amer: 12 mL/min — ABNORMAL LOW (ref >60)
GFR calc non Af Amer: 10 mL/min — ABNORMAL LOW (ref >60)
Glucose, Bld: 163 mg/dL — ABNORMAL HIGH (ref 70–99)
POTASSIUM: 4 mmol/L (ref 3.5–5.1)
Phosphorus: 2.4 mg/dL — ABNORMAL LOW (ref 2.5–4.6)
Sodium: 132 mmol/L — ABNORMAL LOW (ref 135–145)

## 2018-06-20 LAB — CBC
HEMATOCRIT: 21.7 % — AB (ref 39.0–52.0)
Hemoglobin: 6.8 g/dL — CL (ref 13.0–17.0)
MCH: 30.4 pg (ref 26.0–34.0)
MCHC: 31.3 g/dL (ref 30.0–36.0)
MCV: 96.9 fL (ref 78.0–100.0)
Platelets: 359 10*3/uL (ref 150–400)
RBC: 2.24 MIL/uL — ABNORMAL LOW (ref 4.22–5.81)
RDW: 15.9 % — ABNORMAL HIGH (ref 11.5–15.5)
WBC: 17.7 10*3/uL — AB (ref 4.0–10.5)

## 2018-06-20 LAB — HEPARIN INDUCED PLATELET AB (HIT ANTIBODY): HEPARIN INDUCED PLT AB: 0.186 {OD_unit} (ref 0.000–0.400)

## 2018-06-20 LAB — VANCOMYCIN, TROUGH
VANCOMYCIN TR: 24 ug/mL — AB (ref 15–20)
VANCOMYCIN TR: 13 ug/mL — AB (ref 15–20)

## 2018-06-20 NOTE — Progress Notes (Signed)
[facility]ulmonary [facility]   PULMONARY SERVICE  PROGRESS NOTE  Date of Service: 06/20/2018  Alec Hammedichard Bertone Jr.  UJW:[id number]  DOB: 09-04-1946   DOA: 06/12/2018  Referring Physician: Carron CurieAli Hijazi, MD  HPI: Alec HammedRichard Kinderman Jr. is a 72 y.o. male seen for follow up of Acute on Chronic Respiratory Failure.  Patient is currently on full vent support.  Has not been tolerating weaning.  Also was noted to have low hemoglobin.  Currently is on assist control mode  Medications: Reviewed on Rounds  Physical Exam:  Vitals: Temperature 98.0 pulse 80 respiratory rate 26 blood pressure 99/58 saturations 98%  Ventilator Settings mode of ventilation assist control FiO2 30% tidal volume 564 PEEP 5  . General: Comfortable at this time . Eyes: Grossly normal lids, irises & conjunctiva . ENT: grossly tongue is normal . Neck: no obvious mass . Cardiovascular: S1 S2 normal no gallop . Respiratory: No rhonchi or rales are noted at this time . Abdomen: soft . Skin: no rash seen on limited exam . Musculoskeletal: not rigid . Psychiatric:unable to assess . Neurologic: no seizure no involuntary movements         Lab Data:   Basic Metabolic Panel: Recent Labs  Lab 06/15/18 0701 06/17/18 1213 06/18/18 0733 06/20/18 0500  NA 128* 128* 127* 132*  K 4.1 5.0 4.7 4.0  CL 89* 86* 85* 91*  CO2 25 24 26 27   GLUCOSE 174* 171* 170* 163*  BUN 76* 87* 99* 83*  CREATININE 4.37* 4.64* 5.13* 5.06*  CALCIUM 8.5* 8.1* 8.1* 8.1*  PHOS 2.8 3.8 4.1 2.4*    Liver Function Tests: Recent Labs  Lab 06/15/18 0701 06/17/18 1213 06/18/18 0733 06/20/18 0500  ALBUMIN 2.5* 2.5* 2.3* 2.2*   No results for input(s): LIPASE, AMYLASE in the last 168 hours. No results for input(s): AMMONIA in the last 168 hours.  CBC: Recent Labs  Lab 06/15/18 0701 06/17/18 0748 06/18/18 0733 06/19/18 0506 06/20/18 0500  WBC 15.1* 16.4* 14.6* 14.1* 17.7*  HGB 7.5* 8.0* 7.0* 7.1* 6.8*   HCT 23.7* 25.1* 21.7* 22.1* 21.7*  MCV 94.8 95.1 93.9 94.8 96.9  PLT 400 398 393 348 359    Cardiac Enzymes: No results for input(s): CKTOTAL, CKMB, CKMBINDEX, TROPONINI in the last 168 hours.  BNP (last 3 results) No results for input(s): BNP in the last 8760 hours.  ProBNP (last 3 results) No results for input(s): PROBNP in the last 8760 hours.  Radiological Exams: No results found.  Assessment/Plan Active Problems:   Acute on chronic respiratory failure with hypoxia (HCC)   End stage renal disease on dialysis (HCC)   Quadriplegia (HCC)   Seizure disorder (HCC)   1. Acute on chronic respiratory failure with hypoxia continue with assist control continue supportive care right now on 30% FiO2 with a PEEP of 5 2. End-stage renal disease on hemodialysis nephrology recommendations 3. Quadriplegia at baseline not changed 4. Seizure disorder no active seizures are noted at this time   I have personally seen and evaluated the patient, evaluated laboratory and imaging results, formulated the assessment and plan and placed orders. The Patient requires high complexity decision making for assessment and support.  Case was discussed on Rounds with the Respiratory Therapy Staff  Yevonne PaxSaadat A Khan, MD [facility] Pulmonary Critical Care Medicine Sleep Medicine

## 2018-06-20 NOTE — Anesthesia Preprocedure Evaluation (Addendum)
Anesthesia Evaluation  Patient identified by MRN, date of birth, ID band Patient awake    Reviewed: Allergy & Precautions, NPO status , Patient's Chart, lab work & pertinent test results  Airway Mallampati: Intubated       Dental  (+) Dental Advisory Given   Pulmonary neg pulmonary ROS,    + rhonchi  + decreased breath sounds      Cardiovascular negative cardio ROS   Rhythm:Irregular Rate:Tachycardia     Neuro/Psych Seizures -,  negative psych ROS   GI/Hepatic negative GI ROS, Neg liver ROS,   Endo/Other  negative endocrine ROS  Renal/GU ESRF and Renal InsufficiencyRenal disease     Musculoskeletal negative musculoskeletal ROS (+)   Abdominal   Peds  Hematology negative hematology ROS (+) anemia ,   Anesthesia Other Findings   Reproductive/Obstetrics negative OB ROS                            Anesthesia Physical Anesthesia Plan  ASA: IV  Anesthesia Plan: General   Post-op Pain Management:    Induction: Inhalational  PONV Risk Score and Plan: Treatment may vary due to age or medical condition and Ondansetron  Airway Management Planned:   Additional Equipment:   Intra-op Plan:   Post-operative Plan: Post-operative intubation/ventilation  Informed Consent: I have reviewed the patients History and Physical, chart, labs and discussed the procedure including the risks, benefits and alternatives for the proposed anesthesia with the patient or authorized representative who has indicated his/her understanding and acceptance.   Dental advisory given  Plan Discussed with: CRNA  Anesthesia Plan Comments:         Anesthesia Quick Evaluation

## 2018-06-21 ENCOUNTER — Encounter: Admission: RE | Disposition: E | Payer: Self-pay | Source: Ambulatory Visit | Attending: Internal Medicine

## 2018-06-21 ENCOUNTER — Encounter (HOSPITAL_COMMUNITY): Payer: Medicare Other | Admitting: Certified Registered Nurse Anesthetist

## 2018-06-21 ENCOUNTER — Other Ambulatory Visit (HOSPITAL_COMMUNITY): Payer: Medicare Other

## 2018-06-21 DIAGNOSIS — G8250 Quadriplegia, unspecified: Secondary | ICD-10-CM | POA: Diagnosis not present

## 2018-06-21 DIAGNOSIS — J9621 Acute and chronic respiratory failure with hypoxia: Secondary | ICD-10-CM | POA: Diagnosis not present

## 2018-06-21 DIAGNOSIS — G40909 Epilepsy, unspecified, not intractable, without status epilepticus: Secondary | ICD-10-CM | POA: Diagnosis not present

## 2018-06-21 DIAGNOSIS — N186 End stage renal disease: Secondary | ICD-10-CM | POA: Diagnosis not present

## 2018-06-21 HISTORY — PX: TRACHEOSTOMY TUBE PLACEMENT: SHX814

## 2018-06-21 LAB — HEMOGLOBIN AND HEMATOCRIT, BLOOD
HCT: 25.8 % — ABNORMAL LOW (ref 39.0–52.0)
Hemoglobin: 8.3 g/dL — ABNORMAL LOW (ref 13.0–17.0)

## 2018-06-21 LAB — CULTURE, BLOOD (ROUTINE X 2)
CULTURE: NO GROWTH
CULTURE: NO GROWTH
SPECIAL REQUESTS: ADEQUATE
SPECIAL REQUESTS: ADEQUATE

## 2018-06-21 LAB — TYPE AND SCREEN
ABO/RH(D): B POS
ANTIBODY SCREEN: NEGATIVE
Unit division: 0

## 2018-06-21 LAB — BPAM RBC
Blood Product Expiration Date: 201908162359
ISSUE DATE / TIME: 201907251038
Unit Type and Rh: 7300

## 2018-06-21 SURGERY — CREATION, TRACHEOSTOMY
Anesthesia: General

## 2018-06-21 MED ORDER — PHENYLEPHRINE 40 MCG/ML (10ML) SYRINGE FOR IV PUSH (FOR BLOOD PRESSURE SUPPORT)
PREFILLED_SYRINGE | INTRAVENOUS | Status: AC
  Filled 2018-06-21: qty 10

## 2018-06-21 MED ORDER — FENTANYL CITRATE (PF) 250 MCG/5ML IJ SOLN
INTRAMUSCULAR | Status: AC
  Filled 2018-06-21: qty 5

## 2018-06-21 MED ORDER — LIDOCAINE-EPINEPHRINE 1 %-1:100000 IJ SOLN
INTRAMUSCULAR | Status: DC | PRN
  Administered 2018-06-21: 4 mL

## 2018-06-21 MED ORDER — ONDANSETRON HCL 4 MG/2ML IJ SOLN
INTRAMUSCULAR | Status: AC
  Filled 2018-06-21: qty 2

## 2018-06-21 MED ORDER — ONDANSETRON HCL 4 MG/2ML IJ SOLN
INTRAMUSCULAR | Status: DC | PRN
  Administered 2018-06-21: 4 mg via INTRAVENOUS

## 2018-06-21 MED ORDER — FENTANYL CITRATE (PF) 100 MCG/2ML IJ SOLN
INTRAMUSCULAR | Status: DC | PRN
  Administered 2018-06-21: 50 ug via INTRAVENOUS

## 2018-06-21 MED ORDER — ROCURONIUM BROMIDE 10 MG/ML (PF) SYRINGE
PREFILLED_SYRINGE | INTRAVENOUS | Status: AC
  Filled 2018-06-21: qty 10

## 2018-06-21 MED ORDER — 0.9 % SODIUM CHLORIDE (POUR BTL) OPTIME
TOPICAL | Status: DC | PRN
  Administered 2018-06-21: 1000 mL

## 2018-06-21 MED ORDER — EPHEDRINE SULFATE 50 MG/ML IJ SOLN
INTRAMUSCULAR | Status: DC | PRN
  Administered 2018-06-21: 10 mg via INTRAVENOUS

## 2018-06-21 MED ORDER — PHENYLEPHRINE HCL 10 MG/ML IJ SOLN
INTRAMUSCULAR | Status: DC | PRN
  Administered 2018-06-21 (×4): 80 ug via INTRAVENOUS

## 2018-06-21 MED ORDER — SODIUM CHLORIDE 0.9 % IV SOLN
INTRAVENOUS | Status: DC | PRN
  Administered 2018-06-21: 08:00:00 via INTRAVENOUS

## 2018-06-21 MED ORDER — MIDAZOLAM HCL 2 MG/2ML IJ SOLN
INTRAMUSCULAR | Status: AC
  Filled 2018-06-21: qty 2

## 2018-06-21 MED ORDER — PROPOFOL 10 MG/ML IV BOLUS
INTRAVENOUS | Status: AC
  Filled 2018-06-21: qty 20

## 2018-06-21 MED FILL — Medication: Qty: 1 | Status: AC

## 2018-06-21 SURGICAL SUPPLY — 42 items
ATTRACTOMAT 16X20 MAGNETIC DRP (DRAPES) IMPLANT
BLADE SURG 15 STRL LF DISP TIS (BLADE) IMPLANT
BLADE SURG 15 STRL SS (BLADE)
CLEANER TIP ELECTROSURG 2X2 (MISCELLANEOUS) ×3 IMPLANT
CLIP VESOCCLUDE SM WIDE 24/CT (CLIP) ×3 IMPLANT
COVER SURGICAL LIGHT HANDLE (MISCELLANEOUS) ×3 IMPLANT
DRAPE HALF SHEET 40X57 (DRAPES) ×3 IMPLANT
ELECT COATED BLADE 2.86 ST (ELECTRODE) ×3 IMPLANT
ELECT REM PT RETURN 9FT ADLT (ELECTROSURGICAL) ×3
ELECTRODE REM PT RTRN 9FT ADLT (ELECTROSURGICAL) ×1 IMPLANT
GAUZE SPONGE 4X4 16PLY XRAY LF (GAUZE/BANDAGES/DRESSINGS) ×3 IMPLANT
GEL ULTRASOUND 20GR AQUASONIC (MISCELLANEOUS) ×3 IMPLANT
GLOVE BIOGEL PI IND STRL 8 (GLOVE) ×2 IMPLANT
GLOVE BIOGEL PI INDICATOR 8 (GLOVE) ×4
GLOVE ECLIPSE 8.0 STRL XLNG CF (GLOVE) ×3 IMPLANT
GLOVE SS BIOGEL STRL SZ 7.5 (GLOVE) ×1 IMPLANT
GLOVE SUPERSENSE BIOGEL SZ 7.5 (GLOVE) ×2
GOWN STRL REUS W/ TWL LRG LVL3 (GOWN DISPOSABLE) IMPLANT
GOWN STRL REUS W/ TWL XL LVL3 (GOWN DISPOSABLE) ×2 IMPLANT
GOWN STRL REUS W/TWL LRG LVL3 (GOWN DISPOSABLE)
GOWN STRL REUS W/TWL XL LVL3 (GOWN DISPOSABLE) ×4
HOLDER TRACH TUBE VELCRO 19.5 (MISCELLANEOUS) ×3 IMPLANT
KIT BASIN OR (CUSTOM PROCEDURE TRAY) ×3 IMPLANT
KIT SUCTION CATH 14FR (SUCTIONS) ×3 IMPLANT
KIT TURNOVER KIT B (KITS) ×3 IMPLANT
NEEDLE HYPO 25GX1X1/2 BEV (NEEDLE) ×3 IMPLANT
NS IRRIG 1000ML POUR BTL (IV SOLUTION) ×3 IMPLANT
PACK EENT II TURBAN DRAPE (CUSTOM PROCEDURE TRAY) ×3 IMPLANT
PAD ARMBOARD 7.5X6 YLW CONV (MISCELLANEOUS) IMPLANT
PENCIL BUTTON HOLSTER BLD 10FT (ELECTRODE) ×3 IMPLANT
SPONGE DRAIN TRACH 4X4 STRL 2S (GAUZE/BANDAGES/DRESSINGS) ×3 IMPLANT
SPONGE INTESTINAL PEANUT (DISPOSABLE) ×3 IMPLANT
SUT SILK 2 0 SH CR/8 (SUTURE) ×3 IMPLANT
SUT SILK 3 0 TIES 10X30 (SUTURE) IMPLANT
SYR 5ML LUER SLIP (SYRINGE) ×3 IMPLANT
SYR CONTROL 10ML LL (SYRINGE) ×3 IMPLANT
TOWEL OR 17X24 6PK STRL BLUE (TOWEL DISPOSABLE) ×3 IMPLANT
TOWEL OR 17X26 10 PK STRL BLUE (TOWEL DISPOSABLE) ×3 IMPLANT
TUBE CONNECTING 12'X1/4 (SUCTIONS) ×1
TUBE CONNECTING 12X1/4 (SUCTIONS) ×2 IMPLANT
TUBE TRACH SHILEY  6 DIST  CUF (TUBING) ×3 IMPLANT
TUBE TRACH SHILEY 8 DIST CUF (TUBING) IMPLANT

## 2018-06-21 NOTE — Brief Op Note (Signed)
06/15/2018  8:47 AM  PATIENT:  Carley Hammedichard Moist Jr.  72 y.o. male  PRE-OPERATIVE DIAGNOSIS:  RESPIRATORY FAILURE  POST-OPERATIVE DIAGNOSIS:  RESPIRATORY FAILURE  PROCEDURE:  Procedure(s) with comments: TRACHEOSTOMY (N/A) - Using 6 Shiley Cuffed Tracheostomy Tube  SURGEON:  Surgeon(s) and Role:    Drema Halon* Talin Feister E, MD - Primary  PHYSICIAN ASSISTANT:   ASSISTANTS: none   ANESTHESIA:   general  EBL:  minimal   BLOOD ADMINISTERED:none  DRAINS: none   LOCAL MEDICATIONS USED:  XYLOCAINE with EPI 4 cc  SPECIMEN:  No Specimen  DISPOSITION OF SPECIMEN:  N/A  COUNTS:  YES  TOURNIQUET:  * No tourniquets in log *  DICTATION: .Other Dictation: Dictation Number [id number]  PLAN OF CARE: Discharge to home after PACU  PATIENT DISPOSITION:  PACU - hemodynamically stable.   Delay start of Pharmacological VTE agent (>24hrs) due to surgical blood loss or risk of bleeding: yes

## 2018-06-21 NOTE — Progress Notes (Signed)
[facility]  ROUNDING NOTE   Subjective:  Patient remains critically ill. Still on the ventilator. Underwent tracheostomy today Had issues with low blood pressure from anesthesia  Objective:  Vital signs in last 24 hours:  Blood pressure 103/53, heart rate 85, respirations 22, oxygen saturation 95% Ventilator FiO2 30%  Physical Exam: General: Critically ill appearing  Head:  NG tube in place     Neck:  Tracheostomy  Lungs:   Ventilator assisted  Heart: S1S2 no rubs  Abdomen:  Soft, nontender, bowel sounds present  Extremities: 1+ peripheral edema.  Neurologic:   Skin: No lesions  Access: R IJ permcath    Basic Metabolic Panel: Recent Labs  Lab 06/15/18 0701 06/17/18 1213 06/18/18 0733 06/20/18 0500  NA 128* 128* 127* 132*  K 4.1 5.0 4.7 4.0  CL 89* 86* 85* 91*  CO2 25 24 26 27   GLUCOSE 174* 171* 170* 163*  BUN 76* 87* 99* 83*  CREATININE 4.37* 4.64* 5.13* 5.06*  CALCIUM 8.5* 8.1* 8.1* 8.1*  PHOS 2.8 3.8 4.1 2.4*    Liver Function Tests: Recent Labs  Lab 06/15/18 0701 06/17/18 1213 06/18/18 0733 06/20/18 0500  ALBUMIN 2.5* 2.5* 2.3* 2.2*   No results for input(s): LIPASE, AMYLASE in the last 168 hours. No results for input(s): AMMONIA in the last 168 hours.  CBC: Recent Labs  Lab 06/15/18 0701 06/17/18 0748 06/18/18 0733 06/19/18 0506 06/20/18 0500 06/15/2018 0400  WBC 15.1* 16.4* 14.6* 14.1* 17.7*  --   HGB 7.5* 8.0* 7.0* 7.1* 6.8* 8.3*  HCT 23.7* 25.1* 21.7* 22.1* 21.7* 25.8*  MCV 94.8 95.1 93.9 94.8 96.9  --   PLT 400 398 393 348 359  --     Cardiac Enzymes: No results for input(s): CKTOTAL, CKMB, CKMBINDEX, TROPONINI in the last 168 hours.  BNP: Invalid input(s): POCBNP  CBG: No results for input(s): GLUCAP in the last 168 hours.  Microbiology: Results for orders placed or performed during the hospital encounter of 06/13/18  Culture, respiratory (NON-Expectorated)     Status: None   Collection Time: 06-13-2018  4:00 PM   Result Value Ref Range Status   Specimen Description TRACHEAL ASPIRATE  Final   Special Requests NONE  Final   Gram Stain   Final    MODERATE WBC PRESENT,BOTH PMN AND MONONUCLEAR RARE GRAM POSITIVE COCCI    Culture   Final    Consistent with normal respiratory flora. Performed at [facility], [address], [place], [place] [address]    Report Status 06/08/2018 FINAL  Final  Culture, Urine     Status: None   Collection Time: Jun 13, 2018  6:02 PM  Result Value Ref Range Status   Specimen Description URINE, RANDOM  Final   Special Requests NONE  Final   Culture   Final    NO GROWTH Performed at [facility], [address] [place], [place] [address]    Report Status 06/08/2018 FINAL  Final  C Difficile Quick Screen w PCR reflex     Status: None   Collection Time: 06/13/18  6:10 PM  Result Value Ref Range Status   C Diff antigen NEGATIVE NEGATIVE Final   C Diff toxin NEGATIVE NEGATIVE Final   C Diff interpretation No C. difficile detected.  Final  Culture, respiratory (NON-Expectorated)     Status: None   Collection Time: 06/11/18  1:23 PM  Result Value Ref Range Status   Specimen Description TRACHEAL ASPIRATE  Final   Special  Requests NONE  Final   Gram Stain   Final    ABUNDANT WBC PRESENT,BOTH PMN AND MONONUCLEAR ABUNDANT GRAM POSITIVE COCCI IN PAIRS MODERATE BUDDING YEAST SEEN    Culture   Final    Consistent with normal respiratory flora. Performed at [facility], [address], [place], [place] [address]    Report Status 06/13/2018 FINAL  Final  Culture, Urine     Status: None   Collection Time: 06/12/18  6:59 AM  Result Value Ref Range Status   Specimen Description URINE, RANDOM  Final   Special Requests NONE  Final   Culture   Final    NO GROWTH Performed at [facility], [address], [place], [place] [address]    Report Status 06/13/2018 FINAL  Final  Culture, Urine     Status: None   Collection Time: 06/16/18 10:35 AM   Result Value Ref Range Status   Specimen Description URINE, RANDOM  Final   Special Requests NONE  Final   Culture   Final    NO GROWTH Performed at [facility], [address], [place], [place] [address]    Report Status 06/17/2018 FINAL  Final  Culture, blood (routine x 2)     Status: None   Collection Time: 06/16/18 10:39 AM  Result Value Ref Range Status   Specimen Description BLOOD RIGHT ANTECUBITAL  Final   Special Requests   Final    BOTTLES DRAWN AEROBIC ONLY Blood Culture adequate volume   Culture   Final    NO GROWTH 5 DAYS Performed at [facility], [address], [place], [place] [address]    Report Status 06/02/2018 FINAL  Final  Culture, blood (routine x 2)     Status: None   Collection Time: 06/16/18 10:39 AM  Result Value Ref Range Status   Specimen Description BLOOD RIGHT ANTECUBITAL  Final   Special Requests   Final    BOTTLES DRAWN AEROBIC ONLY Blood Culture adequate volume   Culture   Final    NO GROWTH 5 DAYS Performed at [facility], [address] [place], [place] [address]    Report Status 06/03/2018 FINAL  Final  Culture, respiratory (non-expectorated)     Status: None (Preliminary result)   Collection Time: 06/20/18  7:00 PM  Result Value Ref Range Status   Specimen Description TRACHEAL ASPIRATE  Final   Special Requests NONE  Final   Gram Stain   Final    ABUNDANT WBC PRESENT, PREDOMINANTLY PMN RARE GRAM POSITIVE COCCI    Culture   Final    CULTURE REINCUBATED FOR BETTER GROWTH Performed at [facility], [address], [place], [place] [address]    Report Status PENDING  Incomplete    Coagulation Studies: No results for input(s): LABPROT, INR in the last 72 hours.  Urinalysis: No results for input(s): COLORURINE, LABSPEC, PHURINE, GLUCOSEU, HGBUR, BILIRUBINUR, KETONESUR, PROTEINUR, UROBILINOGEN, NITRITE, LEUKOCYTESUR in the last 72 hours.  Invalid input(s): APPERANCEUR    Imaging: Dg Abd Portable  1v  Result Date: 06/04/2018 CLINICAL DATA:  Nasogastric tube placement. EXAM: PORTABLE ABDOMEN - 1 VIEW COMPARISON:  06/11/2018 FINDINGS: Nasogastric tube tip extends below the diaphragm into the proximal stomach. Side hole of the tube lies in the expected location of the gastroesophageal junction. IMPRESSION: 1. Nasogastric tube tip projects in proximal stomach. Electronically Signed   By: Amie Portland M.D.   On: 06/09/2018 10:43  Medications:       Assessment/ Plan:  72 y.o. male with a PMHx of ESRD on HD MWF prior to admission here, seizure disorder, hypertension, anemia chronic kidney disease, secondary hyperparathyroidism, who was admitted to [facility] on 06/03/2018 for ongoing treatment of acute respiratory failure, paraplegia with minimal right arm movement, end-stage renal disease.   1.  ESRD with right internal jugular PermCath.     We will prepare dialysis orders for Saturday Minimal uf as BP is low D/c hydralazine.  2. Anemia chronic kidney disease.    Continue retacrit [id number] units IV with HD.   3.  Secondary hyperparathyroidism.    Phosphorus low Lower dose of renvela  4.  Acute respiratory failure.    Patient remains on the ventilator.   Tracheostomy done today   LOS: 0 Alec Bush [id number] PM

## 2018-06-21 NOTE — Transfer of Care (Signed)
Immediate Anesthesia Transfer of Care Note  Patient: Alec HammedRichard Bingley Jr.  Procedure(s) Performed: TRACHEOSTOMY (N/A )  Patient Location: [facility] E 05  Anesthesia Type:General  Level of Consciousness: Patient remains intubated per anesthesia plan  Airway & Oxygen Therapy: Patient remains intubated per anesthesia plan  Post-op Assessment: Report given to RN and Post -op Vital signs reviewed and stable  Post vital signs: Reviewed and stable  Last Vitals:  Vitals Value Taken Time  BP    Temp    Pulse    Resp    SpO2      Last Pain: There were no vitals filed for this visit.       Complications: No apparent anesthesia complications

## 2018-06-21 NOTE — Anesthesia Postprocedure Evaluation (Addendum)
Anesthesia Post Note  Patient: Alec HammedRichard Golliday Jr.  Procedure(s) Performed: TRACHEOSTOMY (N/A )     Patient location during evaluation: Other ([facility]) Anesthesia Type: General Level of consciousness: sedated and patient remains intubated per anesthesia plan Pain management: pain level controlled Vital Signs Assessment: post-procedure vital signs reviewed and stable Respiratory status: patient remains intubated per anesthesia plan and patient on ventilator - see flowsheet for VS Cardiovascular status: stable Anesthetic complications: no    Last Vitals: There were no vitals filed for this visit.  Last Pain: There were no vitals filed for this visit.               Lewie LoronJohn Quadir Muns

## 2018-06-21 NOTE — Progress Notes (Signed)
Pulmonary Critical Care Medicine [facility] [place]   PULMONARY SERVICE  PROGRESS NOTE  Date of Service: 29-Jun-2018  Alec Bush.  WRU:[id number]  DOB: 03-25-1946   DOA: 06/05/2018  Referring Physician: Carron Curie, MD  HPI: Alec Bush. is a 72 y.o. male seen for follow up of Acute on Chronic Respiratory Failure.  Patient had his tracheostomy done today.  Right now is on the ventilator on assist control mode.  Has been having some issues with blood pressure sore throat therefore the weaning is being held  Medications: Reviewed on Rounds  Physical Exam:  Vitals: Temperature 98.6 pulse 85 respiratory 18 blood pressure 91/48 saturations 97%  Ventilator Settings mode of ventilation assist control FiO2 30% tidal volume 430 PEEP 5  . General: Comfortable at this time . Eyes: Grossly normal lids, irises & conjunctiva . ENT: grossly tongue is normal . Neck: no obvious mass . Cardiovascular: S1 S2 normal no gallop . Respiratory: Coarse breath sounds are noted no rhonchi . Abdomen: soft . Skin: no rash seen on limited exam . Musculoskeletal: not rigid . Psychiatric:unable to assess . Neurologic: no seizure no involuntary movements         Lab Data:   Basic Metabolic Panel: Recent Labs  Lab 06/15/18 0701 06/17/18 1213 06/18/18 0733 06/20/18 0500  NA 128* 128* 127* 132*  K 4.1 5.0 4.7 4.0  CL 89* 86* 85* 91*  CO2 25 24 26 27   GLUCOSE 174* 171* 170* 163*  BUN 76* 87* 99* 83*  CREATININE 4.37* 4.64* 5.13* 5.06*  CALCIUM 8.5* 8.1* 8.1* 8.1*  PHOS 2.8 3.8 4.1 2.4*    Liver Function Tests: Recent Labs  Lab 06/15/18 0701 06/17/18 1213 06/18/18 0733 06/20/18 0500  ALBUMIN 2.5* 2.5* 2.3* 2.2*   No results for input(s): LIPASE, AMYLASE in the last 168 hours. No results for input(s): AMMONIA in the last 168 hours.  CBC: Recent Labs  Lab 06/15/18 0701 06/17/18 0748 06/18/18 0733 06/19/18 0506 06/20/18 0500 06/29/2018 0400  WBC 15.1* 16.4*  14.6* 14.1* 17.7*  --   HGB 7.5* 8.0* 7.0* 7.1* 6.8* 8.3*  HCT 23.7* 25.1* 21.7* 22.1* 21.7* 25.8*  MCV 94.8 95.1 93.9 94.8 96.9  --   PLT 400 398 393 348 359  --     Cardiac Enzymes: No results for input(s): CKTOTAL, CKMB, CKMBINDEX, TROPONINI in the last 168 hours.  BNP (last 3 results) No results for input(s): BNP in the last 8760 hours.  ProBNP (last 3 results) No results for input(s): PROBNP in the last 8760 hours.  Radiological Exams: Dg Abd Portable 1v  Result Date: Jun 29, 2018 CLINICAL DATA:  Nasogastric tube placement. EXAM: PORTABLE ABDOMEN - 1 VIEW COMPARISON:  06/05/2018 FINDINGS: Nasogastric tube tip extends below the diaphragm into the proximal stomach. Side hole of the tube lies in the expected location of the gastroesophageal junction. IMPRESSION: 1. Nasogastric tube tip projects in proximal stomach. Electronically Signed   By: Amie Portland M.D.   On: 29-Jun-2018 10:43    Assessment/Plan Active Problems:   Acute on chronic respiratory failure with hypoxia (HCC)   End stage renal disease on dialysis (HCC)   Quadriplegia (HCC)   Seizure disorder (HCC)   1. Acute on chronic respiratory failure with hypoxia holding the wean right now we will continue with supportive care will reassess again tomorrow.  Hopefully part pressures will improve and the patient can be weaned 2. End-stage renal disease on dialysis continue present management 3. Quadriplegia stable 4. Seizure disorder  at baseline no active seizures   I have personally seen and evaluated the patient, evaluated laboratory and imaging results, formulated the assessment and plan and placed orders. The Patient requires high complexity decision making for assessment and support.  Case was discussed on Rounds with the Respiratory Therapy Staff  Yevonne PaxSaadat A Khan, MD [facility] Pulmonary Critical Care Medicine Sleep Medicine

## 2018-06-21 NOTE — H&P (Signed)
PREOPERATIVE H&P  Chief Complaint: Acute respiratory failure for trach  HPI: Alec HammedRichard Mccown Jr. is a 72 y.o. male who presents for evaluation of acute respiratory failure.  Patient apparently fell while at home sustaining a C5-6 disc herniation with quadriplegia.  He underwent a C3-C6 ACDF on 6/27 by [facility].  Patient has a previous history of end-stage renal disease and is on dialysis.  History of COPD.  Patient was subsequently intubated and [facility] intubated on vent.  Weaning has been unsuccessful in tracheotomy was recommended.  He has a trach collar in place.  Discussion with the neurosurgeon that performed The three-level fusion felt it to be safe to remove the collar and that he should be stable.  He is taken to the operating room at this time for tracheotomy.  Past Medical History:  Diagnosis Date  . Acute on chronic respiratory failure with hypoxia (HCC) 06/09/2018  . Chronic anemia   . End stage renal disease on dialysis (HCC) 06/09/2018  . End stage renal failure on dialysis (HCC)   . Quadriplegia (HCC) 06/09/2018  . Seizure disorder (HCC) 06/09/2018   Past Surgical History:  Procedure Laterality Date  . ACDF    . CERVICAL FUSION     Social History   Socioeconomic History  . Marital status: Single    Spouse name: Not on file  . Number of children: Not on file  . Years of education: Not on file  . Highest education level: Not on file  Occupational History  . Not on file  Social Needs  . Financial resource strain: Not on file  . Food insecurity:    Worry: Not on file    Inability: Not on file  . Transportation needs:    Medical: Not on file    Non-medical: Not on file  Tobacco Use  . Smoking status: Unknown If Ever Smoked  . Smokeless tobacco: Never Used  Substance and Sexual Activity  . Alcohol use: Not Currently  . Drug use: Not Currently  . Sexual activity: Not on file  Lifestyle  . Physical  activity:    Days per week: Not on file    Minutes per session: Not on file  . Stress: Not on file  Relationships  . Social connections:    Talks on phone: Not on file    Gets together: Not on file    Attends religious service: Not on file    Active member of club or organization: Not on file    Attends meetings of clubs or organizations: Not on file    Relationship status: Not on file  Other Topics Concern  . Not on file  Social History Narrative  . Not on file   Family History  Family history unknown: Yes   Allergies not on file Prior to Admission medications   Not on File     Positive ROS: neg  All other systems have been reviewed and were otherwise negative with the exception of those mentioned in the HPI and as above.  Physical Exam: There were no vitals filed for this visit.  General: Intubated on vent.  Minimally responsive.  Obese. Neck: No palpable adenopathy or thyroid nodules.  Trachea midline to palpation.  Trach collar in place. Cardiovascular: Regular rate and rhythm, no murmur.  Respiratory: Clear to auscultation   Assessment/Plan: RESPIRATORY FAILURE Plan for Procedure(s): TRACHEOSTOMY   Alec Cannonhristopher Korinna Tat, MD Jan 27, 2018 7:32 AM

## 2018-06-22 DIAGNOSIS — J9621 Acute and chronic respiratory failure with hypoxia: Secondary | ICD-10-CM | POA: Diagnosis not present

## 2018-06-22 DIAGNOSIS — G8250 Quadriplegia, unspecified: Secondary | ICD-10-CM | POA: Diagnosis not present

## 2018-06-22 DIAGNOSIS — G40909 Epilepsy, unspecified, not intractable, without status epilepticus: Secondary | ICD-10-CM | POA: Diagnosis not present

## 2018-06-22 DIAGNOSIS — N186 End stage renal disease: Secondary | ICD-10-CM | POA: Diagnosis not present

## 2018-06-22 LAB — CBC
HCT: 24.6 % — ABNORMAL LOW (ref 39.0–52.0)
Hemoglobin: 7.7 g/dL — ABNORMAL LOW (ref 13.0–17.0)
MCH: 30.4 pg (ref 26.0–34.0)
MCHC: 31.3 g/dL (ref 30.0–36.0)
MCV: 97.2 fL (ref 78.0–100.0)
PLATELETS: 334 10*3/uL (ref 150–400)
RBC: 2.53 MIL/uL — ABNORMAL LOW (ref 4.22–5.81)
RDW: 16.4 % — AB (ref 11.5–15.5)
WBC: 27.5 10*3/uL — ABNORMAL HIGH (ref 4.0–10.5)

## 2018-06-22 LAB — RENAL FUNCTION PANEL
Albumin: 2.5 g/dL — ABNORMAL LOW (ref 3.5–5.0)
Anion gap: 14 (ref 5–15)
BUN: 63 mg/dL — AB (ref 8–23)
CALCIUM: 8.3 mg/dL — AB (ref 8.9–10.3)
CO2: 25 mmol/L (ref 22–32)
CREATININE: 4.26 mg/dL — AB (ref 0.61–1.24)
Chloride: 95 mmol/L — ABNORMAL LOW (ref 98–111)
GFR calc Af Amer: 15 mL/min — ABNORMAL LOW (ref >60)
GFR calc non Af Amer: 13 mL/min — ABNORMAL LOW (ref >60)
GLUCOSE: 201 mg/dL — AB (ref 70–99)
Phosphorus: 4 mg/dL (ref 2.5–4.6)
Potassium: 4.2 mmol/L (ref 3.5–5.1)
SODIUM: 134 mmol/L — AB (ref 135–145)

## 2018-06-22 LAB — CULTURE, RESPIRATORY W GRAM STAIN

## 2018-06-22 LAB — MAGNESIUM: Magnesium: 2.5 mg/dL — ABNORMAL HIGH (ref 1.7–2.4)

## 2018-06-22 LAB — CULTURE, RESPIRATORY: CULTURE: NORMAL

## 2018-06-22 NOTE — Progress Notes (Signed)
[facility] [place]   PULMONARY SERVICE  PROGRESS NOTE  Date of Service: 06/22/2018  Alec Hammedichard Racette Jr.  ZOX:[id number]  DOB: 01/22/1946   DOA: 05/28/2018  Referring Physician: Carron CurieAli Hijazi, MD  HPI: Alec HammedRichard Cong Jr. is a 72 y.o. male seen for follow up of Acute on Chronic Respiratory Failure.  Was attempted on pressure support weaning today did not tolerate it patient is back on assist control  Medications: Reviewed on Rounds  Physical Exam:  Vitals: Temperature 98.6 pulse 85 respiratory rate 20 blood pressure 1 4458 saturations 94%  Ventilator Settings mode of ventilation assist control FiO2 40% tidal volume 463 PEEP 5  . General: Comfortable at this time . Eyes: Grossly normal lids, irises & conjunctiva . ENT: grossly tongue is normal . Neck: no obvious mass . Cardiovascular: S1 S2 normal no gallop . Respiratory: Good air entry no rhonchi no rales . Abdomen: soft . Skin: no rash seen on limited exam . Musculoskeletal: not rigid . Psychiatric:unable to assess . Neurologic: no seizure no involuntary movements         Lab Data:   Basic Metabolic Panel: Recent Labs  Lab 06/17/18 1213 06/18/18 0733 06/20/18 0500 06/22/18 0540  NA 128* 127* 132* 134*  K 5.0 4.7 4.0 4.2  CL 86* 85* 91* 95*  CO2 24 26 27 25   GLUCOSE 171* 170* 163* 201*  BUN 87* 99* 83* 63*  CREATININE 4.64* 5.13* 5.06* 4.26*  CALCIUM 8.1* 8.1* 8.1* 8.3*  MG  --   --   --  2.5*  PHOS 3.8 4.1 2.4* 4.0    Liver Function Tests: Recent Labs  Lab 06/17/18 1213 06/18/18 0733 06/20/18 0500 06/22/18 0540  ALBUMIN 2.5* 2.3* 2.2* 2.5*   No results for input(s): LIPASE, AMYLASE in the last 168 hours. No results for input(s): AMMONIA in the last 168 hours.  CBC: Recent Labs  Lab 06/17/18 0748 06/18/18 0733 06/19/18 0506 06/20/18 0500 06/07/2018 0400 06/22/18 0540  WBC 16.4* 14.6* 14.1* 17.7*  --  27.5*  HGB 8.0* 7.0* 7.1* 6.8* 8.3* 7.7*  HCT 25.1*  21.7* 22.1* 21.7* 25.8* 24.6*  MCV 95.1 93.9 94.8 96.9  --  97.2  PLT 398 393 348 359  --  334    Cardiac Enzymes: No results for input(s): CKTOTAL, CKMB, CKMBINDEX, TROPONINI in the last 168 hours.  BNP (last 3 results) No results for input(s): BNP in the last 8760 hours.  ProBNP (last 3 results) No results for input(s): PROBNP in the last 8760 hours.  Radiological Exams: Dg Abd Portable 1v  Result Date: 06/04/2018 CLINICAL DATA:  Nasogastric tube placement. EXAM: PORTABLE ABDOMEN - 1 VIEW COMPARISON:  05/29/2018 FINDINGS: Nasogastric tube tip extends below the diaphragm into the proximal stomach. Side hole of the tube lies in the expected location of the gastroesophageal junction. IMPRESSION: 1. Nasogastric tube tip projects in proximal stomach. Electronically Signed   By: Amie Portlandavid  Ormond M.D.   On: 06/14/2018 10:43    Assessment/Plan Active Problems:   Acute on chronic respiratory failure with hypoxia (HCC)   End stage renal disease on dialysis (HCC)   Quadriplegia (HCC)   Seizure disorder (HCC)   1. Acute on chronic respiratory failure with hypoxia we will continue with assessing the our SBI mechanics to try to wean.  Today patient failed we will reassess again tomorrow 2. End-stage renal disease followed by nephrology for dialysis 3. Quadriplegia at baseline 4. Seizure disorder no active seizures are noted  I have personally seen and evaluated the patient, evaluated laboratory and imaging results, formulated the assessment and plan and placed orders. The Patient requires high complexity decision making for assessment and support.  Case was discussed on Rounds with the Respiratory Therapy Staff  Allyne Gee, MD [facility] Pulmonary Critical Care Medicine Sleep Medicine

## 2018-06-23 DIAGNOSIS — G40909 Epilepsy, unspecified, not intractable, without status epilepticus: Secondary | ICD-10-CM | POA: Diagnosis not present

## 2018-06-23 DIAGNOSIS — G8250 Quadriplegia, unspecified: Secondary | ICD-10-CM | POA: Diagnosis not present

## 2018-06-23 DIAGNOSIS — J9621 Acute and chronic respiratory failure with hypoxia: Secondary | ICD-10-CM | POA: Diagnosis not present

## 2018-06-23 DIAGNOSIS — N186 End stage renal disease: Secondary | ICD-10-CM | POA: Diagnosis not present

## 2018-06-23 NOTE — Progress Notes (Signed)
[facility] [place]   PULMONARY SERVICE  PROGRESS NOTE  Date of Service: 06/23/2018  Carley Hammedichard Sturgell Jr.  ZOX:[id number]  DOB: 11/20/1946   DOA: 06/14/2018  Referring Physician: Carron CurieAli Hijazi, MD  HPI: Carley HammedRichard Waldeck Jr. is a 72 y.o. male seen for follow up of Acute on Chronic Respiratory Failure.  Spontaneous breathing index was poor this morning.  Patient was not started on a wean.  Right now remains in assist control mode  Medications: Reviewed on Rounds  Physical Exam:  Vitals: Temperature 97.2 pulse 87 respiratory rate 20 blood pressure 105/59 saturations 100%  Ventilator Settings mode of ventilation assist control FiO2 40% tidal volume 480 PEEP 5  . General: Comfortable at this time . Eyes: Grossly normal lids, irises & conjunctiva . ENT: grossly tongue is normal . Neck: no obvious mass . Cardiovascular: S1 S2 normal no gallop . Respiratory: Good air entry noted bilaterally few distant rhonchi . Abdomen: soft . Skin: no rash seen on limited exam . Musculoskeletal: not rigid . Psychiatric:unable to assess . Neurologic: no seizure no involuntary movements         Lab Data:   Basic Metabolic Panel: Recent Labs  Lab 06/17/18 1213 06/18/18 0733 06/20/18 0500 06/22/18 0540  NA 128* 127* 132* 134*  K 5.0 4.7 4.0 4.2  CL 86* 85* 91* 95*  CO2 24 26 27 25   GLUCOSE 171* 170* 163* 201*  BUN 87* 99* 83* 63*  CREATININE 4.64* 5.13* 5.06* 4.26*  CALCIUM 8.1* 8.1* 8.1* 8.3*  MG  --   --   --  2.5*  PHOS 3.8 4.1 2.4* 4.0    Liver Function Tests: Recent Labs  Lab 06/17/18 1213 06/18/18 0733 06/20/18 0500 06/22/18 0540  ALBUMIN 2.5* 2.3* 2.2* 2.5*   No results for input(s): LIPASE, AMYLASE in the last 168 hours. No results for input(s): AMMONIA in the last 168 hours.  CBC: Recent Labs  Lab 06/17/18 0748 06/18/18 0733 06/19/18 0506 06/20/18 0500 06/15/2018 0400 06/22/18 0540  WBC 16.4* 14.6* 14.1* 17.7*  --  27.5*   HGB 8.0* 7.0* 7.1* 6.8* 8.3* 7.7*  HCT 25.1* 21.7* 22.1* 21.7* 25.8* 24.6*  MCV 95.1 93.9 94.8 96.9  --  97.2  PLT 398 393 348 359  --  334    Cardiac Enzymes: No results for input(s): CKTOTAL, CKMB, CKMBINDEX, TROPONINI in the last 168 hours.  BNP (last 3 results) No results for input(s): BNP in the last 8760 hours.  ProBNP (last 3 results) No results for input(s): PROBNP in the last 8760 hours.  Radiological Exams: No results found.  Assessment/Plan Active Problems:   Acute on chronic respiratory failure with hypoxia (HCC)   End stage renal disease on dialysis (HCC)   Quadriplegia (HCC)   Seizure disorder (HCC)   1. Acute on chronic respiratory failure with hypoxia we will continue to assess the SBI continue pulmonary toilet supportive care hold off on weaning today 2. End-stage renal disease on hemodialysis we will continue to monitor 3. Quadriplegia at baseline 4. Seizure disorder no active seizures are noted   I have personally seen and evaluated the patient, evaluated laboratory and imaging results, formulated the assessment and plan and placed orders. The Patient requires high complexity decision making for assessment and support.  Case was discussed on Rounds with the Respiratory Therapy Staff  Yevonne PaxSaadat A , MD [facility] Pulmonary Critical Care Medicine Sleep Medicine

## 2018-06-24 ENCOUNTER — Encounter (HOSPITAL_COMMUNITY): Payer: Self-pay | Admitting: Otolaryngology

## 2018-06-24 ENCOUNTER — Other Ambulatory Visit (HOSPITAL_COMMUNITY): Payer: Medicare Other

## 2018-06-24 DIAGNOSIS — J9621 Acute and chronic respiratory failure with hypoxia: Secondary | ICD-10-CM | POA: Diagnosis not present

## 2018-06-24 DIAGNOSIS — N186 End stage renal disease: Secondary | ICD-10-CM | POA: Diagnosis not present

## 2018-06-24 DIAGNOSIS — G8250 Quadriplegia, unspecified: Secondary | ICD-10-CM | POA: Diagnosis not present

## 2018-06-24 DIAGNOSIS — G40909 Epilepsy, unspecified, not intractable, without status epilepticus: Secondary | ICD-10-CM | POA: Diagnosis not present

## 2018-06-24 LAB — RENAL FUNCTION PANEL
Albumin: 2.3 g/dL — ABNORMAL LOW (ref 3.5–5.0)
Anion gap: 15 (ref 5–15)
BUN: 62 mg/dL — ABNORMAL HIGH (ref 8–23)
CO2: 22 mmol/L (ref 22–32)
Calcium: 7.8 mg/dL — ABNORMAL LOW (ref 8.9–10.3)
Chloride: 96 mmol/L — ABNORMAL LOW (ref 98–111)
Creatinine, Ser: 4.38 mg/dL — ABNORMAL HIGH (ref 0.61–1.24)
GFR calc Af Amer: 14 mL/min — ABNORMAL LOW (ref >60)
GFR calc non Af Amer: 12 mL/min — ABNORMAL LOW (ref >60)
Glucose, Bld: 132 mg/dL — ABNORMAL HIGH (ref 70–99)
Phosphorus: 3.3 mg/dL (ref 2.5–4.6)
Potassium: 4.3 mmol/L (ref 3.5–5.1)
Sodium: 133 mmol/L — ABNORMAL LOW (ref 135–145)

## 2018-06-24 LAB — CBC
HCT: 23.4 % — ABNORMAL LOW (ref 39.0–52.0)
Hemoglobin: 7.2 g/dL — ABNORMAL LOW (ref 13.0–17.0)
MCH: 30.1 pg (ref 26.0–34.0)
MCHC: 30.8 g/dL (ref 30.0–36.0)
MCV: 97.9 fL (ref 78.0–100.0)
Platelets: 334 10*3/uL (ref 150–400)
RBC: 2.39 MIL/uL — ABNORMAL LOW (ref 4.22–5.81)
RDW: 15.9 % — ABNORMAL HIGH (ref 11.5–15.5)
WBC: 22.9 10*3/uL — ABNORMAL HIGH (ref 4.0–10.5)

## 2018-06-24 LAB — MAGNESIUM: MAGNESIUM: 2.5 mg/dL — AB (ref 1.7–2.4)

## 2018-06-24 NOTE — Progress Notes (Signed)
[facility]  ROUNDING NOTE   Subjective:  Patient breathing comfortably.  Still on the vent.    Objective:  Vital signs in last 24 hours:  Temperature 101.2 pulse 106 respirations 24 blood pressure 157/64  Physical Exam: General: Critically ill appearing  Head: NG tube in place  Eyes: anicteric  Neck: Tracheostomy  Lungs:  Ventilator assisted, scattered rhonchi  Heart: S1S2 no rubs  Abdomen:  Soft, nontender, bowel sounds present  Extremities: 1+ peripheral edema.  Neurologic: Awake  Skin: No lesions  Access: R IJ permcath    Basic Metabolic Panel: Recent Labs  Lab 06/18/18 0733 06/20/18 0500 06/22/18 0540 06/24/18 0653  NA 127* 132* 134* 133*  K 4.7 4.0 4.2 4.3  CL 85* 91* 95* 96*  CO2 26 27 25 22   GLUCOSE 170* 163* 201* 132*  BUN 99* 83* 63* 62*  CREATININE 5.13* 5.06* 4.26* 4.38*  CALCIUM 8.1* 8.1* 8.3* 7.8*  MG  --   --  2.5* 2.5*  PHOS 4.1 2.4* 4.0 3.3    Liver Function Tests: Recent Labs  Lab 06/18/18 0733 06/20/18 0500 06/22/18 0540 06/24/18 0653  ALBUMIN 2.3* 2.2* 2.5* 2.3*   No results for input(s): LIPASE, AMYLASE in the last 168 hours. No results for input(s): AMMONIA in the last 168 hours.  CBC: Recent Labs  Lab 06/18/18 0733 06/19/18 0506 06/20/18 0500 05/31/2018 0400 06/22/18 0540 06/24/18 0653  WBC 14.6* 14.1* 17.7*  --  27.5* 22.9*  HGB 7.0* 7.1* 6.8* 8.3* 7.7* 7.2*  HCT 21.7* 22.1* 21.7* 25.8* 24.6* 23.4*  MCV 93.9 94.8 96.9  --  97.2 97.9  PLT 393 348 359  --  334 334    Cardiac Enzymes: No results for input(s): CKTOTAL, CKMB, CKMBINDEX, TROPONINI in the last 168 hours.  BNP: Invalid input(s): POCBNP  CBG: No results for input(s): GLUCAP in the last 168 hours.  Microbiology: Results for orders placed or performed during the hospital encounter of 10/28/18  Culture, respiratory (NON-Expectorated)     Status: None   Collection Time: 10/28/18  4:00 PM  Result Value Ref Range Status   Specimen Description  TRACHEAL ASPIRATE  Final   Special Requests NONE  Final   Gram Stain   Final    MODERATE WBC PRESENT,BOTH PMN AND MONONUCLEAR RARE GRAM POSITIVE COCCI    Culture   Final    Consistent with normal respiratory flora. Performed at [facility], [address] [place], [place] [address]    Report Status 06/08/2018 FINAL  Final  Culture, Urine     Status: None   Collection Time: 10/28/18  6:02 PM  Result Value Ref Range Status   Specimen Description URINE, RANDOM  Final   Special Requests NONE  Final   Culture   Final    NO GROWTH Performed at [facility], [address] [place], [place] [address]    Report Status 06/08/2018 FINAL  Final  C Difficile Quick Screen w PCR reflex     Status: None   Collection Time: 10/28/18  6:10 PM  Result Value Ref Range Status   C Diff antigen NEGATIVE NEGATIVE Final   C Diff toxin NEGATIVE NEGATIVE Final   C Diff interpretation No C. difficile detected.  Final  Culture, respiratory (NON-Expectorated)     Status: None   Collection Time: 06/11/18  1:23 PM  Result Value Ref Range Status   Specimen Description TRACHEAL ASPIRATE  Final   Special Requests NONE  Final  Gram Stain   Final    ABUNDANT WBC PRESENT,BOTH PMN AND MONONUCLEAR ABUNDANT GRAM POSITIVE COCCI IN PAIRS MODERATE BUDDING YEAST SEEN    Culture   Final    Consistent with normal respiratory flora. Performed at [facility], [address], [place], [place] [address]    Report Status 06/13/2018 FINAL  Final  Culture, Urine     Status: None   Collection Time: 06/12/18  6:59 AM  Result Value Ref Range Status   Specimen Description URINE, RANDOM  Final   Special Requests NONE  Final   Culture   Final    NO GROWTH Performed at [facility], [address], [place], [place] [address]    Report Status 06/13/2018 FINAL  Final  Culture, Urine     Status: None   Collection Time: 06/16/18 10:35 AM  Result Value Ref Range Status   Specimen Description URINE,  RANDOM  Final   Special Requests NONE  Final   Culture   Final    NO GROWTH Performed at [facility], [address] [place], [place] [address]    Report Status 06/17/2018 FINAL  Final  Culture, blood (routine x 2)     Status: None   Collection Time: 06/16/18 10:39 AM  Result Value Ref Range Status   Specimen Description BLOOD RIGHT ANTECUBITAL  Final   Special Requests   Final    BOTTLES DRAWN AEROBIC ONLY Blood Culture adequate volume   Culture   Final    NO GROWTH 5 DAYS Performed at [facility], [address], [place], [place] [address]    Report Status 06/20/2018 FINAL  Final  Culture, blood (routine x 2)     Status: None   Collection Time: 06/16/18 10:39 AM  Result Value Ref Range Status   Specimen Description BLOOD RIGHT ANTECUBITAL  Final   Special Requests   Final    BOTTLES DRAWN AEROBIC ONLY Blood Culture adequate volume   Culture   Final    NO GROWTH 5 DAYS Performed at [facility], [address] [place], [place] [address]    Report Status 06/14/2018 FINAL  Final  Culture, respiratory (non-expectorated)     Status: None   Collection Time: 06/20/18  7:00 PM  Result Value Ref Range Status   Specimen Description TRACHEAL ASPIRATE  Final   Special Requests NONE  Final   Gram Stain   Final    ABUNDANT WBC PRESENT, PREDOMINANTLY PMN RARE GRAM POSITIVE COCCI    Culture   Final    Consistent with normal respiratory flora. Performed at [facility], [address] [place], [place] [address]    Report Status 06/22/2018 FINAL  Final    Coagulation Studies: No results for input(s): LABPROT, INR in the last 72 hours.  Urinalysis: No results for input(s): COLORURINE, LABSPEC, PHURINE, GLUCOSEU, HGBUR, BILIRUBINUR, KETONESUR, PROTEINUR, UROBILINOGEN, NITRITE, LEUKOCYTESUR in the last 72 hours.  Invalid input(s): APPERANCEUR    Imaging: Dg Chest Port 1 View  Result Date: 06/24/2018 CLINICAL DATA:  72 year old male with a history of  respiratory failure EXAM: PORTABLE CHEST 1 VIEW COMPARISON:  06/16/2018, 06/13/2018, 2018-06-30 FINDINGS: Cardiomediastinal silhouette unchanged in size and contour. Heart borders partially obscured by overlying lung/pleural disease. Interval extubation with surgical tracheostomy, projecting over the midline. Unchanged gastric tube. Unchanged right IJ approach tunneled hemodialysis catheter, with the tip appearing to terminate superior vena cava. Similar appearance of right greater than left  basilar opacities obscuring the hemidiaphragm and heart borders. No pneumothorax.  Interlobular septal thickening. IMPRESSION: Similar appearance of the chest x-ray with right greater than left mixed interstitial and airspace opacities, with right greater than left pleural effusion. Interval extubation with surgical tracheostomy. Unchanged gastric tube. Unchanged right IJ tunneled hemodialysis catheter Electronically Signed   By: Gilmer Mor D.O.   On: 06/24/2018 07:33     Medications:       Assessment/ Plan:  72 y.o. male with a PMHx of ESRD on HD MWF prior to admission here, seizure disorder, hypertension, anemia chronic kidney disease, secondary hyperparathyroidism, who was admitted to [facility] on 06/14/18 for ongoing treatment of acute respiratory failure, paraplegia with minimal right arm movement, end-stage renal disease.   1.  ESRD with right internal jugular PermCath.     -We will plan for dialysis again tomorrow.    2. Anemia chronic kidney disease.    Continue retacrit 09811 units IV with HD.    3.  Secondary hyperparathyroidism.    Phosphorus was 2.5 on Saturday.  Repeat this tomorrow.  4.  Acute respiratory failure.    Patient still on the ventilator.  Tracheostomy appears to be functioning well.   LOS: 0 Alec Bush 7/29/20193:07 PM

## 2018-06-24 NOTE — Progress Notes (Signed)
Pulmonary Critical Care Medicine [facility] [place]   PULMONARY SERVICE  PROGRESS NOTE  Date of Service: 06/24/2018  Alec Bush.  OZH:[id number]  DOB: 1946/08/03   DOA: 06/14/2018  Referring Physician: Carron Curie, MD  HPI: Alec Bush. is a 72 y.o. male seen for follow up of Acute on Chronic Respiratory Failure.  Most recent chest x-ray showed increased fluid along with pleural effusions also underlying pneumonitis cannot be ruled out.  Patient's been consistently failing weaning attempts.  Remains on assist control  Medications: Reviewed on Rounds  Physical Exam:  Vitals: Temperature 101.2 pulse 106 respiratory rate 24 blood pressure 157/71 saturations 100%  Ventilator Settings mode of ventilation assist control FiO2 40% tidal volume 384 PEEP 5  . General: Comfortable at this time . Eyes: Grossly normal lids, irises & conjunctiva . ENT: grossly tongue is normal . Neck: no obvious mass . Cardiovascular: S1 S2 normal no gallop . Respiratory: Scattered rhonchi diminished at the right base . Abdomen: soft . Skin: no rash seen on limited exam . Musculoskeletal: not rigid . Psychiatric:unable to assess . Neurologic: no seizure no involuntary movements         Lab Data:   Basic Metabolic Panel: Recent Labs  Lab 06/18/18 0733 06/20/18 0500 06/22/18 0540 06/24/18 0653  NA 127* 132* 134* 133*  K 4.7 4.0 4.2 4.3  CL 85* 91* 95* 96*  CO2 26 27 25 22   GLUCOSE 170* 163* 201* 132*  BUN 99* 83* 63* 62*  CREATININE 5.13* 5.06* 4.26* 4.38*  CALCIUM 8.1* 8.1* 8.3* 7.8*  MG  --   --  2.5* 2.5*  PHOS 4.1 2.4* 4.0 3.3    Liver Function Tests: Recent Labs  Lab 06/18/18 0733 06/20/18 0500 06/22/18 0540 06/24/18 0653  ALBUMIN 2.3* 2.2* 2.5* 2.3*   No results for input(s): LIPASE, AMYLASE in the last 168 hours. No results for input(s): AMMONIA in the last 168 hours.  CBC: Recent Labs  Lab 06/18/18 0733 06/19/18 0506 06/20/18 0500 06/14/2018 0400  06/22/18 0540 06/24/18 0653  WBC 14.6* 14.1* 17.7*  --  27.5* 22.9*  HGB 7.0* 7.1* 6.8* 8.3* 7.7* 7.2*  HCT 21.7* 22.1* 21.7* 25.8* 24.6* 23.4*  MCV 93.9 94.8 96.9  --  97.2 97.9  PLT 393 348 359  --  334 334    Cardiac Enzymes: No results for input(s): CKTOTAL, CKMB, CKMBINDEX, TROPONINI in the last 168 hours.  BNP (last 3 results) No results for input(s): BNP in the last 8760 hours.  ProBNP (last 3 results) No results for input(s): PROBNP in the last 8760 hours.  Radiological Exams: Dg Chest Port 1 View  Result Date: 06/24/2018 CLINICAL DATA:  72 year old male with a history of respiratory failure EXAM: PORTABLE CHEST 1 VIEW COMPARISON:  06/16/2018, 06/13/2018, 06/24/2018 FINDINGS: Cardiomediastinal silhouette unchanged in size and contour. Heart borders partially obscured by overlying lung/pleural disease. Interval extubation with surgical tracheostomy, projecting over the midline. Unchanged gastric tube. Unchanged right IJ approach tunneled hemodialysis catheter, with the tip appearing to terminate superior vena cava. Similar appearance of right greater than left basilar opacities obscuring the hemidiaphragm and heart borders. No pneumothorax.  Interlobular septal thickening. IMPRESSION: Similar appearance of the chest x-ray with right greater than left mixed interstitial and airspace opacities, with right greater than left pleural effusion. Interval extubation with surgical tracheostomy. Unchanged gastric tube. Unchanged right IJ tunneled hemodialysis catheter Electronically Signed   By: Gilmer Mor D.O.   On: 06/24/2018 07:33    Assessment/Plan  Active Problems:   Acute on chronic respiratory failure with hypoxia (HCC)   End stage renal disease on dialysis (HCC)   Quadriplegia (HCC)   Seizure disorder (HCC)   1. Acute on chronic respiratory failure with hypoxia we will continue with full vent support has been consistently failing pressure support trials likely related to  underlying fluid overload and effusions along with the pneumonia we will get a follow-up chest x-ray discussed on rounds with primary care regarding the antibiotic management also.  We will continue with supportive care 2. End-stage renal disease followed by nephrology continue with hemodialysis may benefit from pulling extra fluid off 3. Quadriplegia at baseline 4. seizure disorder no active seizures 5. Lumbar pneumonia as above we will adjust antibiotics and also get cultures   I have personally seen and evaluated the patient, evaluated laboratory and imaging results, formulated the assessment and plan and placed orders. The Patient requires high complexity decision making for assessment and support.  Case was discussed on Rounds with the Respiratory Therapy Staff  Alec PaxSaadat A Khan, MD [facility] Pulmonary Critical Care Medicine Sleep Medicine

## 2018-06-24 NOTE — Op Note (Signed)
NAME: Carley HammedSCOTT JR., Sher MEDICAL RECORD ZO:[id number] ACCOUNT [id number] DATE OF BIRTH:1946/03/21 FACILITY: [facility] LOCATION: [facility] PHYSICIAN:CHRISTOPHER Braxton FeathersE. NEWMAN, MD  OPERATIVE REPORT  DATE OF PROCEDURE:  08/09/2018  PREOPERATIVE DIAGNOSIS:  Acute on chronic respiratory failure.  POSTOPERATIVE DIAGNOSIS:   Acute on chronic respiratory failure.  OPERATION PERFORMED:   Tracheostomy with a #6 cuffed Shiley tracheotomy tube.  SURGEON:  Kristine GarbeChristopher E.  Ezzard StandingNewman, M.D.  ANESTHESIA:  General endotracheal.  ESTIMATED BLOOD LOSS:  Minimal, less than 20 mL.  COMPLICATIONS:  None.  BRIEF CLINICAL NOTE:  The patient is a 72 year old gentleman who has a history of end-stage renal disease on dialysis.  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  He  underwent decompression and a 3-level fusion by neurosurgery about 5 weeks ago.  He was intubated postoperatively and weaning was unsuccessful.  The patient was subsequently [facility] about 2 weeks ago, intubated on a vent.   Weaning him off the ventilator have been unsuccessful and a tracheotomy was recommended.  He is taken to the operating room at this time for a tracheotomy.  Discussion with his neurosurgeon felt like the fusion should be stable at this point and that  the trach collar can be removed.  DESCRIPTION OF PROCEDURE:  The patient was taken straight down [facility] to the operating room.  He remained in his bed.  The trach collar was removed.  A small roll was placed underneath the shoulders to slightly extend his neck.   The cricoid cartilage was easily palpable and marked out.  The area was injected with 4 mL of Xylocaine with epinephrine for local hemostasis.  Vertical incision was made just below the cricoid cartilage.  Dissection was carried down through the  subcutaneous tissue with cautery.  There  was one vein on the right side that had to be cauterized to stop bleeding.  No other bleeding was encountered.  The patient had a relatively high thyroid isthmus requiring transection of thyroid isthmus with the  cautery.  The cricoid cartilage and the first 2 tracheal rings were exposed.  Horizontal tracheotomy was performed at the inferior aspect of the first tracheal ring.  The endotracheal tube was removed and a #6 Shiley cuffed tube was inserted without any  difficulty.  The patient was ventilated well following placement of the tracheotomy tube.  This was secured to the neck with 2-0 silk sutures x4 and a Velcro trach collar around the neck.  The oral gastric tube that was removed with the endotracheal tube  was then inserted.  A new NG tube was passed through the right nostril and passed down into the stomach.  This was secured to the nose.    The patient was [facility].  AN/NUANCE  D:08/09/2018 T:08/09/2018 JOB:[id number]

## 2018-06-25 ENCOUNTER — Encounter: Payer: Self-pay | Admitting: Internal Medicine

## 2018-06-25 ENCOUNTER — Other Ambulatory Visit (HOSPITAL_COMMUNITY): Payer: Medicare Other

## 2018-06-25 DIAGNOSIS — J181 Lobar pneumonia, unspecified organism: Secondary | ICD-10-CM

## 2018-06-25 DIAGNOSIS — J90 Pleural effusion, not elsewhere classified: Secondary | ICD-10-CM | POA: Diagnosis not present

## 2018-06-25 DIAGNOSIS — N186 End stage renal disease: Secondary | ICD-10-CM | POA: Diagnosis not present

## 2018-06-25 DIAGNOSIS — J9621 Acute and chronic respiratory failure with hypoxia: Secondary | ICD-10-CM | POA: Diagnosis not present

## 2018-06-25 DIAGNOSIS — J189 Pneumonia, unspecified organism: Secondary | ICD-10-CM

## 2018-06-25 HISTORY — DX: Lobar pneumonia, unspecified organism: J18.1

## 2018-06-25 HISTORY — DX: Pleural effusion, not elsewhere classified: J90

## 2018-06-25 LAB — RENAL FUNCTION PANEL
ANION GAP: 15 (ref 5–15)
Albumin: 2.2 g/dL — ABNORMAL LOW (ref 3.5–5.0)
BUN: 78 mg/dL — ABNORMAL HIGH (ref 8–23)
CHLORIDE: 90 mmol/L — AB (ref 98–111)
CO2: 24 mmol/L (ref 22–32)
CREATININE: 5.07 mg/dL — AB (ref 0.61–1.24)
Calcium: 7.7 mg/dL — ABNORMAL LOW (ref 8.9–10.3)
GFR, EST AFRICAN AMERICAN: 12 mL/min — AB (ref >60)
GFR, EST NON AFRICAN AMERICAN: 10 mL/min — AB (ref >60)
Glucose, Bld: 161 mg/dL — ABNORMAL HIGH (ref 70–99)
POTASSIUM: 4.5 mmol/L (ref 3.5–5.1)
Phosphorus: 4.1 mg/dL (ref 2.5–4.6)
Sodium: 129 mmol/L — ABNORMAL LOW (ref 135–145)

## 2018-06-25 LAB — PROTEIN, PLEURAL OR PERITONEAL FLUID: Total protein, fluid: <3 g/dL

## 2018-06-25 LAB — CBC
HCT: 23.1 % — ABNORMAL LOW (ref 39.0–52.0)
HEMOGLOBIN: 7.3 g/dL — AB (ref 13.0–17.0)
MCH: 30.2 pg (ref 26.0–34.0)
MCHC: 31.6 g/dL (ref 30.0–36.0)
MCV: 95.5 fL (ref 78.0–100.0)
Platelets: 347 10*3/uL (ref 150–400)
RBC: 2.42 MIL/uL — AB (ref 4.22–5.81)
RDW: 15.5 % (ref 11.5–15.5)
WBC: 23.8 10*3/uL — ABNORMAL HIGH (ref 4.0–10.5)

## 2018-06-25 LAB — BASIC METABOLIC PANEL
Anion gap: 11 (ref 5–15)
BUN: 79 mg/dL — ABNORMAL HIGH (ref 8–23)
CHLORIDE: 93 mmol/L — AB (ref 98–111)
CO2: 25 mmol/L (ref 22–32)
CREATININE: 5.06 mg/dL — AB (ref 0.61–1.24)
Calcium: 7.7 mg/dL — ABNORMAL LOW (ref 8.9–10.3)
GFR calc Af Amer: 12 mL/min — ABNORMAL LOW (ref >60)
GFR calc non Af Amer: 10 mL/min — ABNORMAL LOW (ref >60)
GLUCOSE: 163 mg/dL — AB (ref 70–99)
POTASSIUM: 4.5 mmol/L (ref 3.5–5.1)
Sodium: 129 mmol/L — ABNORMAL LOW (ref 135–145)

## 2018-06-25 LAB — GLUCOSE, PLEURAL OR PERITONEAL FLUID: GLUCOSE FL: 215 mg/dL

## 2018-06-25 LAB — C DIFFICILE QUICK SCREEN W PCR REFLEX
C DIFFICILE (CDIFF) INTERP: NOT DETECTED
C Diff antigen: NEGATIVE
C Diff toxin: NEGATIVE

## 2018-06-25 LAB — MAGNESIUM: Magnesium: 2.6 mg/dL — ABNORMAL HIGH (ref 1.7–2.4)

## 2018-06-25 LAB — HEPATIC FUNCTION PANEL
ALBUMIN: 2.3 g/dL — AB (ref 3.5–5.0)
ALK PHOS: 339 U/L — AB (ref 38–126)
ALT: 66 U/L — ABNORMAL HIGH (ref 0–44)
AST: 49 U/L — AB (ref 15–41)
Bilirubin, Direct: 0.3 mg/dL — ABNORMAL HIGH (ref 0.0–0.2)
Indirect Bilirubin: 0.8 mg/dL (ref 0.3–0.9)
TOTAL PROTEIN: 6.4 g/dL — AB (ref 6.5–8.1)
Total Bilirubin: 1.1 mg/dL (ref 0.3–1.2)

## 2018-06-25 LAB — LACTIC ACID, PLASMA: LACTIC ACID, VENOUS: 0.9 mmol/L (ref 0.5–1.9)

## 2018-06-25 MED ORDER — LIDOCAINE HCL (PF) 1 % IJ SOLN
INTRAMUSCULAR | Status: AC
  Filled 2018-06-25: qty 30

## 2018-06-25 NOTE — Progress Notes (Addendum)
Pulmonary Critical Care Medicine [facility]   PULMONARY SERVICE  PROGRESS NOTE  Date of Service: 06/25/2018  Alec Hammedichard Elsass Jr.  ZOX:[id number]  DOB: 12/10/1945   DOA: 06/23/2018  Referring Physician: Carron CurieAli Hijazi, MD  HPI: Alec HammedRichard Wachter Jr. is a 72 y.o. male seen for follow up of Acute on Chronic Respiratory Failure.  Patient is resting comfortably without distress was attempted on pressure support weaning did not tolerate.  Patient is scheduled for thoracentesis for the pleural effusions.  The patient's our SBI mechanics have been poor.  Medications: Reviewed on Rounds  Physical Exam:  Vitals: Temperature 102.5 pulse 101 respiratory rate 32 blood pressure 165/73 saturations 99%  Ventilator Settings mode of ventilation assist control FiO2 30% tidal volume 344 PEEP 5  . General: Comfortable at this time . Eyes: Grossly normal lids, irises & conjunctiva . ENT: grossly tongue is normal . Neck: no obvious mass . Cardiovascular: S1 S2 normal no gallop . Respiratory: Coarse breath sounds scattered rhonchi . Abdomen: soft . Skin: no rash seen on limited exam . Musculoskeletal: not rigid . Psychiatric:unable to assess . Neurologic: no seizure no involuntary movements         Lab Data:   Basic Metabolic Panel: Recent Labs  Lab 06/20/18 0500 06/22/18 0540 06/24/18 0653 06/25/18 0634  NA 132* 134* 133* 129*  129*  K 4.0 4.2 4.3 4.5  4.5  CL 91* 95* 96* 93*  90*  CO2 27 25 22 25  24   GLUCOSE 163* 201* 132* 163*  161*  BUN 83* 63* 62* 79*  78*  CREATININE 5.06* 4.26* 4.38* 5.06*  5.07*  CALCIUM 8.1* 8.3* 7.8* 7.7*  7.7*  MG  --  2.5* 2.5* 2.6*  PHOS 2.4* 4.0 3.3 4.1    Liver Function Tests: Recent Labs  Lab 06/20/18 0500 06/22/18 0540 06/24/18 0653 06/25/18 0634  ALBUMIN 2.2* 2.5* 2.3* 2.2*   No results for input(s): LIPASE, AMYLASE in the last 168 hours. No results for input(s): AMMONIA in the last 168 hours.  CBC: Recent Labs  Lab  06/19/18 0506 06/20/18 0500 06/20/2018 0400 06/22/18 0540 06/24/18 0653 06/25/18 0634  WBC 14.1* 17.7*  --  27.5* 22.9* 23.8*  HGB 7.1* 6.8* 8.3* 7.7* 7.2* 7.3*  HCT 22.1* 21.7* 25.8* 24.6* 23.4* 23.1*  MCV 94.8 96.9  --  97.2 97.9 95.5  PLT 348 359  --  334 334 347    Cardiac Enzymes: No results for input(s): CKTOTAL, CKMB, CKMBINDEX, TROPONINI in the last 168 hours.  BNP (last 3 results) No results for input(s): BNP in the last 8760 hours.  ProBNP (last 3 results) No results for input(s): PROBNP in the last 8760 hours.  Radiological Exams: Dg Chest Port 1 View  Result Date: 06/24/2018 CLINICAL DATA:  72 year old male with a history of respiratory failure EXAM: PORTABLE CHEST 1 VIEW COMPARISON:  06/16/2018, 06/13/2018, 06/24/2018 FINDINGS: Cardiomediastinal silhouette unchanged in size and contour. Heart borders partially obscured by overlying lung/pleural disease. Interval extubation with surgical tracheostomy, projecting over the midline. Unchanged gastric tube. Unchanged right IJ approach tunneled hemodialysis catheter, with the tip appearing to terminate superior vena cava. Similar appearance of right greater than left basilar opacities obscuring the hemidiaphragm and heart borders. No pneumothorax.  Interlobular septal thickening. IMPRESSION: Similar appearance of the chest x-ray with right greater than left mixed interstitial and airspace opacities, with right greater than left pleural effusion. Interval extubation with surgical tracheostomy. Unchanged gastric tube. Unchanged right IJ tunneled hemodialysis catheter Electronically Signed  By: Gilmer Mor D.O.   On: 06/24/2018 07:33    Assessment/Plan Active Problems:   Acute on chronic respiratory failure with hypoxia (HCC)   End stage renal disease on dialysis (HCC)   Quadriplegia (HCC)   Seizure disorder (HCC)   Pleural effusion, right   Lobar pneumonia (HCC)   1. Acute on chronic respiratory failure with hypoxia  patient is not doing well as far as being able to wean we will continue on assist control mode at this time. 2. End-stage renal disease on dialysis nephrology following for dialysis. 3. Quadriplegia unchanged 4. Seizure disorder no active seizures noted. 5. Lobar pneumonia as discussed on rounds will adjust antibiotics to add atypical coverage. 6. Pleural effusion he needs thoracentesis also CT scan of the chest was ordered to further assess.   I have personally seen and evaluated the patient, evaluated laboratory and imaging results, formulated the assessment and plan and placed orders. The Patient requires high complexity decision making for assessment and support.  Case was discussed on Rounds with the Respiratory Therapy Staff time 35 minutes discussion with the primary care physician as well as the medical treatment team  Yevonne Pax, MD [facility] Pulmonary Critical Care Medicine Sleep Medicine

## 2018-06-25 NOTE — Procedures (Signed)
PROCEDURE SUMMARY:  Successful US guided diagnostic and therapeutic right thoracentesis. Yielded 300 mL of amber/blood-tinged fluid. Pt tolerated procedure well. No immediate complications.  Specimen was sent for labs. CXR ordered.  Hoyt KochKacie Sue-Ellen Matthews PA-C 06/25/2018 12:11 PM

## 2018-06-26 ENCOUNTER — Other Ambulatory Visit (HOSPITAL_COMMUNITY): Payer: Medicare Other

## 2018-06-26 ENCOUNTER — Encounter: Payer: Self-pay | Admitting: Internal Medicine

## 2018-06-26 DIAGNOSIS — J189 Pneumonia, unspecified organism: Secondary | ICD-10-CM

## 2018-06-26 DIAGNOSIS — J9621 Acute and chronic respiratory failure with hypoxia: Secondary | ICD-10-CM | POA: Diagnosis not present

## 2018-06-26 DIAGNOSIS — N186 End stage renal disease: Secondary | ICD-10-CM | POA: Diagnosis not present

## 2018-06-26 DIAGNOSIS — Z9889 Other specified postprocedural states: Secondary | ICD-10-CM

## 2018-06-26 DIAGNOSIS — J80 Acute respiratory distress syndrome: Secondary | ICD-10-CM

## 2018-06-26 HISTORY — DX: Acute respiratory distress syndrome: J80

## 2018-06-26 LAB — RENAL FUNCTION PANEL
ALBUMIN: 2.2 g/dL — AB (ref 3.5–5.0)
Anion gap: 10 (ref 5–15)
BUN: 65 mg/dL — ABNORMAL HIGH (ref 8–23)
CHLORIDE: 97 mmol/L — AB (ref 98–111)
CO2: 26 mmol/L (ref 22–32)
CREATININE: 4.12 mg/dL — AB (ref 0.61–1.24)
Calcium: 7.9 mg/dL — ABNORMAL LOW (ref 8.9–10.3)
GFR, EST AFRICAN AMERICAN: 15 mL/min — AB (ref >60)
GFR, EST NON AFRICAN AMERICAN: 13 mL/min — AB (ref >60)
Glucose, Bld: 129 mg/dL — ABNORMAL HIGH (ref 70–99)
PHOSPHORUS: 3.1 mg/dL (ref 2.5–4.6)
Potassium: 4.2 mmol/L (ref 3.5–5.1)
Sodium: 133 mmol/L — ABNORMAL LOW (ref 135–145)

## 2018-06-26 LAB — CBC
HCT: 23.4 % — ABNORMAL LOW (ref 39.0–52.0)
Hemoglobin: 7.4 g/dL — ABNORMAL LOW (ref 13.0–17.0)
MCH: 30.2 pg (ref 26.0–34.0)
MCHC: 31.6 g/dL (ref 30.0–36.0)
MCV: 95.5 fL (ref 78.0–100.0)
PLATELETS: 326 10*3/uL (ref 150–400)
RBC: 2.45 MIL/uL — AB (ref 4.22–5.81)
RDW: 15.5 % (ref 11.5–15.5)
WBC: 21.2 10*3/uL — AB (ref 4.0–10.5)

## 2018-06-26 LAB — AMMONIA: Ammonia: 40 umol/L — ABNORMAL HIGH (ref 9–35)

## 2018-06-26 LAB — PH, BODY FLUID: pH, Body Fluid: 7.5

## 2018-06-26 NOTE — Progress Notes (Signed)
Pulmonary Critical Care Medicine [facility]   PULMONARY SERVICE  PROGRESS NOTE  Date of Service: 06/26/2018  Alec Bush.  JXB:[id number]  DOB: 1946-09-16   DOA: 05/28/2018  Referring Physician: Carron Curie, MD  HPI: Alec Bush. is a 72 y.o. male seen for follow up of Acute on Chronic Respiratory Failure.  Patient has had worsening of his pulmonary status overnight.  Oxygen requirements have gone up.  Patient is currently on 50% FiO2.  He did have a CT scan done which showed multi lobar pneumonia.  I am concerned that at this point patient is probably going into ARDS.  The patient did also have a thoracentesis attempted and he had about 300 cc of fluid removed.  In addition patient did have a midline placement done he is also scheduled to go down for a CT of the abdomen today.  Medications: Reviewed on Rounds  Physical Exam:  Vitals: Temperature is 99.4 pulse 101 respiratory rate 30 blood pressure 104/86 saturations 87%  Ventilator Settings mode of ventilation assist control FiO2 50% tidal volume 380 cc PEEP is 5  . General: Comfortable at this time . Eyes: Grossly normal lids, irises & conjunctiva . ENT: grossly tongue is normal . Neck: no obvious mass . Cardiovascular: S1 S2 normal no gallop . Respiratory: Coarse rhonchi are noted bilaterally . Abdomen: soft . Skin: no rash seen on limited exam . Musculoskeletal: not rigid . Psychiatric:unable to assess . Neurologic: no seizure no involuntary movements         Lab Data:   Basic Metabolic Panel: Recent Labs  Lab 06/20/18 0500 06/22/18 0540 06/24/18 0653 06/25/18 0634 06/26/18 0713  NA 132* 134* 133* 129*  129* 133*  K 4.0 4.2 4.3 4.5  4.5 4.2  CL 91* 95* 96* 93*  90* 97*  CO2 27 25 22 25  24 26   GLUCOSE 163* 201* 132* 163*  161* 129*  BUN 83* 63* 62* 79*  78* 65*  CREATININE 5.06* 4.26* 4.38* 5.06*  5.07* 4.12*  CALCIUM 8.1* 8.3* 7.8* 7.7*  7.7* 7.9*  MG  --  2.5* 2.5* 2.6*   --   PHOS 2.4* 4.0 3.3 4.1 3.1    Liver Function Tests: Recent Labs  Lab 06/22/18 0540 06/24/18 0653 06/25/18 0634 06/25/18 1020 06/26/18 0713  AST  --   --   --  49*  --   ALT  --   --   --  66*  --   ALKPHOS  --   --   --  339*  --   BILITOT  --   --   --  1.1  --   PROT  --   --   --  6.4*  --   ALBUMIN 2.5* 2.3* 2.2* 2.3* 2.2*   No results for input(s): LIPASE, AMYLASE in the last 168 hours. Recent Labs  Lab 06/26/18 0713  AMMONIA 40*    CBC: Recent Labs  Lab 06/20/18 0500 07/19/18 0400 06/22/18 0540 06/24/18 0653 06/25/18 0634 06/26/18 0713  WBC 17.7*  --  27.5* 22.9* 23.8* 21.2*  HGB 6.8* 8.3* 7.7* 7.2* 7.3* 7.4*  HCT 21.7* 25.8* 24.6* 23.4* 23.1* 23.4*  MCV 96.9  --  97.2 97.9 95.5 95.5  PLT 359  --  334 334 347 326    Cardiac Enzymes: No results for input(s): CKTOTAL, CKMB, CKMBINDEX, TROPONINI in the last 168 hours.  BNP (last 3 results) No results for input(s): BNP in the last 8760 hours.  ProBNP (last 3 results) No results for input(s): PROBNP in the last 8760 hours.  Radiological Exams: Dg Chest 1 View  Result Date: 06/25/2018 CLINICAL DATA:  S/P thoracentesis  right EXAM: CHEST  1 VIEW COMPARISON:  the previous day's study FINDINGS: No pneumothorax. Apparent resolution of the right pleural effusion. Persistent blunting of the left lateral costophrenic angle, possible effusion. Patchy airspace opacities in the lung bases, slightly improved on the right since previous study. Tracheostomy device, enteral tube, and tunneled right IJ hemodialysis catheter stable. Cervical fixation hardware partially visualized. IMPRESSION: 1. No pneumothorax post right thoracentesis. Critical Value/emergent results were sent at the time of interpretation on 06/25/2018 at 1:15 pm to [employer] PA, who electronically acknowledged these results. 2. Bibasilar airspace opacities, improved on the right since prior study. 3.  Support hardware stable in position.  Electronically Signed   By: Corlis Leak M.D.   On: 06/25/2018 13:16   Ct Chest Wo Contrast  Result Date: 06/26/2018 CLINICAL DATA:  Fever of unknown origin EXAM: CT CHEST WITHOUT CONTRAST TECHNIQUE: Multidetector CT imaging of the chest was performed following the standard protocol without IV contrast. COMPARISON:  Multiple chest radiographs, most recently 06/25/2018 FINDINGS: Cardiovascular: Heart is top-normal in size. Small pericardial effusion. No evidence of thoracic aortic aneurysm. Mild atherosclerotic calcifications of the aortic arch. Three vessel coronary atherosclerosis. Right IJ dual lumen dialysis catheter terminating at the cavoatrial junction. Additional right IJ catheter terminating at the cavoatrial junction. Mediastinum/Nodes: Small mediastinal lymph nodes, including a 10 mm short axis high right paratracheal node and a 14 mm short axis subcarinal node, poorly visualized. Visualized thyroid is unremarkable. Lungs/Pleura: Tracheostomy in satisfactory position. Multifocal patchy opacities in the bilateral upper lobes, suspicious for pneumonia. Additional patchy right lower lobe opacity, atelectasis versus pneumonia. Patchy left lower lobe opacity, likely compressive atelectasis. Small bilateral pleural effusions. No pneumothorax. Upper Abdomen: Visualized upper abdomen is notable for an enteric tube coursing into the stomach. Musculoskeletal: Cervical spine fixation hardware, incompletely visualized. Thoracic spine is within normal limits. IMPRESSION: Multifocal patchy opacities in the bilateral upper lobes, suspicious for pneumonia. Additional patchy right lower lobe opacity, atelectasis versus pneumonia. Small bilateral pleural effusions. Associated compressive atelectasis in the left lower lobe. Support apparatus as above. Aortic Atherosclerosis (ICD10-I70.0). Electronically Signed   By: Charline Bills M.D.   On: 06/26/2018 02:08   Dg Chest Port 1 View  Result Date: 06/25/2018 CLINICAL  DATA:  Central line placement EXAM: PORTABLE CHEST 1 VIEW COMPARISON:  06/25/2018 FINDINGS: Right jugular central venous catheter tip at the cavoatrial junction. Dual-lumen dialysis catheter unchanged also in the SVC. Tracheostomy in good position. No pneumothorax. NG in the stomach. Bibasilar airspace disease unchanged. IMPRESSION: New right jugular central venous catheter tip at the cavoatrial junction. No pneumothorax Moderate bibasilar atelectasis/infiltrate unchanged. Electronically Signed   By: Marlan Palau M.D.   On: 06/25/2018 18:23   US Thoracentesis Asp Pleural Space W/img Guide  Result Date: 06/25/2018 INDICATION: Patient with history of respiratory failure, right pleural effusion. Request is made for diagnostic and therapeutic thoracentesis. EXAM: ULTRASOUND GUIDED DIAGNOSTIC AND THERAPEUTIC RIGHT THORACENTESIS MEDICATIONS: 20 mL 1% lidocaine COMPLICATIONS: None immediate. PROCEDURE: An ultrasound guided thoracentesis was thoroughly discussed with the patient and questions answered. The benefits, risks, alternatives and complications were also discussed. The patient understands and wishes to proceed with the procedure. Written consent was obtained. Ultrasound was performed to localize and mark an adequate pocket of fluid in the right chest. The area was then prepped and draped in  the normal sterile fashion. 1% Lidocaine was used for local anesthesia. Under ultrasound guidance a 6 Fr Safe-T-Centesis catheter was introduced. Thoracentesis was performed. The catheter was removed and a dressing applied. FINDINGS: A total of approximately 300 mL of amber/blood-tinged fluid was removed. Samples were sent to the laboratory as requested by the clinical team. IMPRESSION: Successful ultrasound guided diagnostic and therapeutic right thoracentesis yielding 300 mL of pleural fluid. Read by: Loyce DysKacie Matthews PA-C Electronically Signed   By: Jolaine ClickArthur  Hoss M.D.   On: 06/25/2018 13:17    Assessment/Plan Active  Problems:   Acute on chronic respiratory failure with hypoxia (HCC)   End stage renal disease on dialysis (HCC)   Quadriplegia (HCC)   Seizure disorder (HCC)   Pleural effusion, right   Multifocal pneumonia   ARDS (adult respiratory distress syndrome) (HCC)   1. Acute on chronic respiratory failure with hypoxia patient's mental status had to be increased in support.  The patient is currently on 50% FiO2.  On assist control mode.  Will be continued on assist control titrate the PEEP follow-up I have asked for respiratory therapy to increase the PEEP.  His saturations were running about 87% at that time. 2. Multifocal pneumonia versus ARDS I have discussed the antibiotic choice with primary care we are going to add atypical coverage.  Will change over to Levaquin along with the vancomycin.  If there is no improvement would consider doing bronchoscopy. 3. End-stage renal disease on hemodialysis followed by nephrology is supposed to be dialyzed we will continue supportive care. 4. Quadriplegia grossly unchanged 5. Seizure disorder has not had any follow-up seizures noted. 6. Pleural effusion status post thoracentesis follow-up on cultures.  Patient is critically ill in danger of cardiac arrest patient needs ongoing intensive monitoring.  Time spent 35 minutes.   I have personally seen and evaluated the patient, evaluated laboratory and imaging results, formulated the assessment and plan and placed orders. The Patient requires high complexity decision making for assessment and support.  Case was discussed on Rounds with the Respiratory Therapy Staff  Yevonne PaxSaadat A Khan, MD [facility]CC[facility] Pulmonary Critical Care Medicine Sleep Medicine

## 2018-06-26 NOTE — Progress Notes (Signed)
[facility]  ROUNDING NOTE   Subjective:  Patient has been spiking fevers over the preceding 24 hours. Temperature this a.m. was 99.4. Patient currently on vancomycin and Levaquin. Due for dialysis later today. Blood cultures thus far negative.   Objective:  Vital signs in last 24 hours:  Temperature 99.4 pulse 101 respirations 30 blood pressure 109/86  Physical Exam: General: Critically ill appearing  Head: NG tube in place  Eyes: anicteric  Neck: Tracheostomy  Lungs:  Ventilator assisted, scattered rhonchi  Heart: S1S2 no rubs  Abdomen:  Soft, nontender, bowel sounds present  Extremities: 1+ peripheral edema.  Neurologic: Awake  Skin: No lesions  Access: R IJ permcath    Basic Metabolic Panel: Recent Labs  Lab 06/20/18 0500 06/22/18 0540 06/24/18 0653 06/25/18 0634 06/26/18 0713  NA 132* 134* 133* 129*  129* 133*  K 4.0 4.2 4.3 4.5  4.5 4.2  CL 91* 95* 96* 93*  90* 97*  CO2 27 25 22 25  24 26   GLUCOSE 163* 201* 132* 163*  161* 129*  BUN 83* 63* 62* 79*  78* 65*  CREATININE 5.06* 4.26* 4.38* 5.06*  5.07* 4.12*  CALCIUM 8.1* 8.3* 7.8* 7.7*  7.7* 7.9*  MG  --  2.5* 2.5* 2.6*  --   PHOS 2.4* 4.0 3.3 4.1 3.1    Liver Function Tests: Recent Labs  Lab 06/22/18 0540 06/24/18 0653 06/25/18 0634 06/25/18 1020 06/26/18 0713  AST  --   --   --  49*  --   ALT  --   --   --  66*  --   ALKPHOS  --   --   --  339*  --   BILITOT  --   --   --  1.1  --   PROT  --   --   --  6.4*  --   ALBUMIN 2.5* 2.3* 2.2* 2.3* 2.2*   No results for input(s): LIPASE, AMYLASE in the last 168 hours. Recent Labs  Lab 06/26/18 0713  AMMONIA 40*    CBC: Recent Labs  Lab 06/20/18 0500 03-Jul-2018 0400 06/22/18 0540 06/24/18 0653 06/25/18 0634 06/26/18 0713  WBC 17.7*  --  27.5* 22.9* 23.8* 21.2*  HGB 6.8* 8.3* 7.7* 7.2* 7.3* 7.4*  HCT 21.7* 25.8* 24.6* 23.4* 23.1* 23.4*  MCV 96.9  --  97.2 97.9 95.5 95.5  PLT 359  --  334 334 347 326    Cardiac  Enzymes: No results for input(s): CKTOTAL, CKMB, CKMBINDEX, TROPONINI in the last 168 hours.  BNP: Invalid input(s): POCBNP  CBG: No results for input(s): GLUCAP in the last 168 hours.  Microbiology: Results for orders placed or performed during the hospital encounter of 05/28/2018  Culture, respiratory (NON-Expectorated)     Status: None   Collection Time: 06/10/2018  4:00 PM  Result Value Ref Range Status   Specimen Description TRACHEAL ASPIRATE  Final   Special Requests NONE  Final   Gram Stain   Final    MODERATE WBC PRESENT,BOTH PMN AND MONONUCLEAR RARE GRAM POSITIVE COCCI    Culture   Final    Consistent with normal respiratory flora. Performed at [facility], [address], [place], [place] [address]    Report Status 06/08/2018 FINAL  Final  Culture, Urine     Status: None   Collection Time: 06/16/2018  6:02 PM  Result Value Ref Range Status   Specimen Description URINE, RANDOM  Final   Special Requests NONE  Final   Culture   Final    NO GROWTH Performed at [facility], [address] [place], [place] [address]    Report Status 06/08/2018 FINAL  Final  C Difficile Quick Screen w PCR reflex     Status: None   Collection Time: 2018/06/28  6:10 PM  Result Value Ref Range Status   C Diff antigen NEGATIVE NEGATIVE Final   C Diff toxin NEGATIVE NEGATIVE Final   C Diff interpretation No C. difficile detected.  Final  Culture, respiratory (NON-Expectorated)     Status: None   Collection Time: 06/11/18  1:23 PM  Result Value Ref Range Status   Specimen Description TRACHEAL ASPIRATE  Final   Special Requests NONE  Final   Gram Stain   Final    ABUNDANT WBC PRESENT,BOTH PMN AND MONONUCLEAR ABUNDANT GRAM POSITIVE COCCI IN PAIRS MODERATE BUDDING YEAST SEEN    Culture   Final    Consistent with normal respiratory flora. Performed at [facility], [address] [place], [place] [address]    Report Status 06/13/2018 FINAL  Final  Culture, Urine      Status: None   Collection Time: 06/12/18  6:59 AM  Result Value Ref Range Status   Specimen Description URINE, RANDOM  Final   Special Requests NONE  Final   Culture   Final    NO GROWTH Performed at [facility], [address] [place], [place] [address]    Report Status 06/13/2018 FINAL  Final  Culture, Urine     Status: None   Collection Time: 06/16/18 10:35 AM  Result Value Ref Range Status   Specimen Description URINE, RANDOM  Final   Special Requests NONE  Final   Culture   Final    NO GROWTH Performed at [facility], [address] [place] [address], [place], [place] [address]    Report Status 06/17/2018 FINAL  Final  Culture, blood (routine x 2)     Status: None   Collection Time: 06/16/18 10:39 AM  Result Value Ref Range Status   Specimen Description BLOOD RIGHT ANTECUBITAL  Final   Special Requests   Final    BOTTLES DRAWN AEROBIC ONLY Blood Culture adequate volume   Culture   Final    NO GROWTH 5 DAYS Performed at [facility], [address] [place], [place] [address]    Report Status 06/09/2018 FINAL  Final  Culture, blood (routine x 2)     Status: None   Collection Time: 06/16/18 10:39 AM  Result Value Ref Range Status   Specimen Description BLOOD RIGHT ANTECUBITAL  Final   Special Requests   Final    BOTTLES DRAWN AEROBIC ONLY Blood Culture adequate volume   Culture   Final    NO GROWTH 5 DAYS Performed at [facility], [address] [place], [place] [address]    Report Status 06/12/2018 FINAL  Final  Culture, respiratory (non-expectorated)     Status: None   Collection Time: 06/20/18  7:00 PM  Result Value Ref Range Status   Specimen Description TRACHEAL ASPIRATE  Final   Special Requests NONE  Final   Gram Stain   Final    ABUNDANT WBC PRESENT, PREDOMINANTLY PMN RARE GRAM POSITIVE COCCI    Culture   Final    Consistent with normal respiratory flora. Performed at [facility], [address], [place], [place] [address]    Report  Status 06/22/2018  FINAL  Final  C difficile quick scan w PCR reflex     Status: None   Collection Time: 06/23/18 10:26 PM  Result Value Ref Range Status   C Diff antigen NEGATIVE NEGATIVE Final   C Diff toxin NEGATIVE NEGATIVE Final   C Diff interpretation No C. difficile detected.  Final    Comment: Performed at [facility], [address] [place], [place] [address]  Culture, blood (routine x 2)     Status: None (Preliminary result)   Collection Time: 06/24/18  6:53 AM  Result Value Ref Range Status   Specimen Description BLOOD BLOOD RIGHT FOREARM  Final   Special Requests   Final    BOTTLES DRAWN AEROBIC ONLY Blood Culture results may not be optimal due to an inadequate volume of blood received in culture bottles   Culture   Final    NO GROWTH 2 DAYS Performed at [facility], [address] [place], [place] [address]    Report Status PENDING  Incomplete  Culture, blood (routine x 2)     Status: None (Preliminary result)   Collection Time: 06/24/18  6:53 AM  Result Value Ref Range Status   Specimen Description BLOOD RIGHT HAND  Final   Special Requests   Final    BOTTLES DRAWN AEROBIC ONLY Blood Culture results may not be optimal due to an inadequate volume of blood received in culture bottles   Culture   Final    NO GROWTH 2 DAYS Performed at [facility], [address], [place], [place] [address]    Report Status PENDING  Incomplete  Culture, body fluid-bottle     Status: None (Preliminary result)   Collection Time: 06/25/18 12:06 PM  Result Value Ref Range Status   Specimen Description PLEURAL FLUID  Final   Special Requests RIGHT  Final   Culture   Final    NO GROWTH < 24 HOURS Performed at [facility], [address], [place], [place] [address]    Report Status PENDING  Incomplete    Coagulation Studies: No results for input(s): LABPROT, INR in the last 72 hours.  Urinalysis: No results for input(s): COLORURINE, LABSPEC, PHURINE,  GLUCOSEU, HGBUR, BILIRUBINUR, KETONESUR, PROTEINUR, UROBILINOGEN, NITRITE, LEUKOCYTESUR in the last 72 hours.  Invalid input(s): APPERANCEUR    Imaging: Dg Chest 1 View  Result Date: 06/25/2018 CLINICAL DATA:  S/P thoracentesis  right EXAM: CHEST  1 VIEW COMPARISON:  the previous day's study FINDINGS: No pneumothorax. Apparent resolution of the right pleural effusion. Persistent blunting of the left lateral costophrenic angle, possible effusion. Patchy airspace opacities in the lung bases, slightly improved on the right since previous study. Tracheostomy device, enteral tube, and tunneled right IJ hemodialysis catheter stable. Cervical fixation hardware partially visualized. IMPRESSION: 1. No pneumothorax post right thoracentesis. Critical Value/emergent results were sent at the time of interpretation on 06/25/2018 at 1:15 pm to [facility] PA, who electronically acknowledged these results. 2. Bibasilar airspace opacities, improved on the right since prior study. 3.  Support hardware stable in position. Electronically Signed   By: Corlis Leak M.D.   On: 06/25/2018 13:16   Ct Chest Wo Contrast  Result Date: 06/26/2018 CLINICAL DATA:  Fever of unknown origin EXAM: CT CHEST WITHOUT CONTRAST TECHNIQUE: Multidetector CT imaging of the chest was performed following the standard protocol without IV contrast. COMPARISON:  Multiple chest radiographs, most recently 06/25/2018 FINDINGS: Cardiovascular: Heart is top-normal in size. Small pericardial effusion. No  evidence of thoracic aortic aneurysm. Mild atherosclerotic calcifications of the aortic arch. Three vessel coronary atherosclerosis. Right IJ dual lumen dialysis catheter terminating at the cavoatrial junction. Additional right IJ catheter terminating at the cavoatrial junction. Mediastinum/Nodes: Small mediastinal lymph nodes, including a 10 mm short axis high right paratracheal node and a 14 mm short axis subcarinal node, poorly visualized. Visualized  thyroid is unremarkable. Lungs/Pleura: Tracheostomy in satisfactory position. Multifocal patchy opacities in the bilateral upper lobes, suspicious for pneumonia. Additional patchy right lower lobe opacity, atelectasis versus pneumonia. Patchy left lower lobe opacity, likely compressive atelectasis. Small bilateral pleural effusions. No pneumothorax. Upper Abdomen: Visualized upper abdomen is notable for an enteric tube coursing into the stomach. Musculoskeletal: Cervical spine fixation hardware, incompletely visualized. Thoracic spine is within normal limits. IMPRESSION: Multifocal patchy opacities in the bilateral upper lobes, suspicious for pneumonia. Additional patchy right lower lobe opacity, atelectasis versus pneumonia. Small bilateral pleural effusions. Associated compressive atelectasis in the left lower lobe. Support apparatus as above. Aortic Atherosclerosis (ICD10-I70.0). Electronically Signed   By: Charline Bills M.D.   On: 06/26/2018 02:08   Dg Chest Port 1 View  Result Date: 06/25/2018 CLINICAL DATA:  Central line placement EXAM: PORTABLE CHEST 1 VIEW COMPARISON:  06/25/2018 FINDINGS: Right jugular central venous catheter tip at the cavoatrial junction. Dual-lumen dialysis catheter unchanged also in the SVC. Tracheostomy in good position. No pneumothorax. NG in the stomach. Bibasilar airspace disease unchanged. IMPRESSION: New right jugular central venous catheter tip at the cavoatrial junction. No pneumothorax Moderate bibasilar atelectasis/infiltrate unchanged. Electronically Signed   By: Marlan Palau M.D.   On: 06/25/2018 18:23   US Thoracentesis Asp Pleural Space W/img Guide  Result Date: 06/25/2018 INDICATION: Patient with history of respiratory failure, right pleural effusion. Request is made for diagnostic and therapeutic thoracentesis. EXAM: ULTRASOUND GUIDED DIAGNOSTIC AND THERAPEUTIC RIGHT THORACENTESIS MEDICATIONS: 20 mL 1% lidocaine COMPLICATIONS: None immediate. PROCEDURE: An  ultrasound guided thoracentesis was thoroughly discussed with the patient and questions answered. The benefits, risks, alternatives and complications were also discussed. The patient understands and wishes to proceed with the procedure. Written consent was obtained. Ultrasound was performed to localize and mark an adequate pocket of fluid in the right chest. The area was then prepped and draped in the normal sterile fashion. 1% Lidocaine was used for local anesthesia. Under ultrasound guidance a 6 Fr Safe-T-Centesis catheter was introduced. Thoracentesis was performed. The catheter was removed and a dressing applied. FINDINGS: A total of approximately 300 mL of amber/blood-tinged fluid was removed. Samples were sent to the laboratory as requested by the clinical team. IMPRESSION: Successful ultrasound guided diagnostic and therapeutic right thoracentesis yielding 300 mL of pleural fluid. Read by: Loyce Dys PA-C Electronically Signed   By: Jolaine Click M.D.   On: 06/25/2018 13:17     Medications:       Assessment/ Plan:  72 y.o. male with a PMHx of ESRD on HD MWF prior to admission here, seizure disorder, hypertension, anemia chronic kidney disease, secondary hyperparathyroidism, who was admitted to [facility] on 06/04/2018 for ongoing treatment of acute respiratory failure, paraplegia with minimal right arm movement, end-stage renal disease.   1.  ESRD with right internal jugular PermCath.     -Patient due for hemodialysis today.  Orders have been prepared.  2. Anemia chronic kidney disease.    Hemoglobin currently 7.4.  Consider transfusion for hemoglobin of 7 or less.  Continue retacrit 40981 units IV with HD.    3.  Secondary hyperparathyroidism.  Phosphorus now up to 3.1.  Continue to monitor.  4.  Acute respiratory failure.    Continue ventilatory support as per critical care.  5.  Fever.  Blood cultures thus far negative.  Patient currently on vancomycin as well as  Levaquin.  Await blood culture data results.  In addition patient has multifocal pneumonia.  New respiratory cultures pending.   LOS: 0 Luverne Zerkle 7/31/20193:14 PM

## 2018-06-27 ENCOUNTER — Other Ambulatory Visit (HOSPITAL_COMMUNITY): Payer: Medicare Other

## 2018-06-27 DIAGNOSIS — J9621 Acute and chronic respiratory failure with hypoxia: Secondary | ICD-10-CM | POA: Diagnosis not present

## 2018-06-27 DIAGNOSIS — N186 End stage renal disease: Secondary | ICD-10-CM | POA: Diagnosis not present

## 2018-06-27 DIAGNOSIS — J80 Acute respiratory distress syndrome: Secondary | ICD-10-CM | POA: Diagnosis not present

## 2018-06-27 DIAGNOSIS — J189 Pneumonia, unspecified organism: Secondary | ICD-10-CM | POA: Diagnosis not present

## 2018-06-27 LAB — CBC
HEMATOCRIT: 23.6 % — AB (ref 39.0–52.0)
Hemoglobin: 7.2 g/dL — ABNORMAL LOW (ref 13.0–17.0)
MCH: 29.5 pg (ref 26.0–34.0)
MCHC: 30.5 g/dL (ref 30.0–36.0)
MCV: 96.7 fL (ref 78.0–100.0)
PLATELETS: 333 10*3/uL (ref 150–400)
RBC: 2.44 MIL/uL — ABNORMAL LOW (ref 4.22–5.81)
RDW: 15.4 % (ref 11.5–15.5)
WBC: 16.9 10*3/uL — ABNORMAL HIGH (ref 4.0–10.5)

## 2018-06-27 NOTE — Consult Note (Signed)
Chief Complaint: Patient was seen in consultation today for percutaneous gastric tube placement at the request of Dr Ardeth Sportsman  Supervising Physician: Richarda Overlie  Patient Status: [facility]  History of Present Illness: Alec Bush. is a 72 y.o. male   ESRD; Seizures; HTN Acute Resp failure Encephalopathy Fell at home--admitted  to SNF Resp failure and intubated/vent Unable to wean-- sent to [facility] for Vent management  Deconditioning Dysphagia-- severe protein calorie malnutrition Need for long term care  Wbc high-- treating PNA-- wbc trending down now Fever still in 100-101 level On Vanco IV and Levaquin BC neg; +UTI 7/21  Requesting percutaneous gastric tube placement Imaging reviewed and Dr Bonnielee Haff has approved procedure With Barium night before- check KUB am of procedure    Past Medical History:  Diagnosis Date  . Acute on chronic respiratory failure with hypoxia (HCC) 06/09/2018  . ARDS (adult respiratory distress syndrome) (HCC) 06/26/2018  . Chronic anemia   . End stage renal disease on dialysis (HCC) 06/09/2018  . End stage renal failure on dialysis (HCC)   . Lobar pneumonia (HCC) 06/25/2018  . Pleural effusion, right 06/25/2018  . Quadriplegia (HCC) 06/09/2018  . Seizure disorder (HCC) 06/09/2018    Past Surgical History:  Procedure Laterality Date  . ACDF    . CERVICAL FUSION    . TRACHEOSTOMY TUBE PLACEMENT N/A 05/28/2018   Procedure: TRACHEOSTOMY;  Surgeon: Drema Halon, MD;  Location: [facility] OR;  Service: ENT;  Laterality: N/A;  Using 6 Shiley Cuffed Tracheostomy Tube    Allergies: Patient has no allergy information on record.  Medications: Prior to Admission medications   Not on File     Family History  Family history unknown: Yes    Social History   Socioeconomic History  . Marital status: Single    Spouse name: Not on file  . Number of children: Not on file  . Years of education: Not on file  . Highest education level: Not on  file  Occupational History  . Not on file  Social Needs  . Financial resource strain: Not on file  . Food insecurity:    Worry: Not on file    Inability: Not on file  . Transportation needs:    Medical: Not on file    Non-medical: Not on file  Tobacco Use  . Smoking status: Unknown If Ever Smoked  . Smokeless tobacco: Never Used  Substance and Sexual Activity  . Alcohol use: Not Currently  . Drug use: Not Currently  . Sexual activity: Not on file  Lifestyle  . Physical activity:    Days per week: Not on file    Minutes per session: Not on file  . Stress: Not on file  Relationships  . Social connections:    Talks on phone: Not on file    Gets together: Not on file    Attends religious service: Not on file    Active member of club or organization: Not on file    Attends meetings of clubs or organizations: Not on file    Relationship status: Not on file  Other Topics Concern  . Not on file  Social History Narrative  . Not on file     Review of Systems: A 12 point ROS discussed and pertinent positives are indicated in the HPI above.  All other systems are negative.  Review of Systems  Constitutional:       Thin Ill appearing  Respiratory:  Vent/trach  Neurological:       Non verbal     Vital Signs: BP (!) 144/65 (BP Location: Right Arm)   Physical Exam  Cardiovascular: Normal rate and regular rhythm.  Pulmonary/Chest:  vent  Abdominal: Soft.  Musculoskeletal:  Moving arms that I can see Does not follow commands  Skin: Skin is warm.  Psychiatric:  Calling son Nash MantisMonta for consent  Vitals reviewed.   Imaging: Ct Abdomen Wo Contrast  Result Date: 06/26/2018 CLINICAL DATA:  Gastrostomy tube evaluation EXAM: CT ABDOMEN WITHOUT CONTRAST TECHNIQUE: Multidetector CT imaging of the abdomen was performed following the standard protocol without IV contrast. COMPARISON:  None. FINDINGS: Lower chest: Bilateral pleural effusions. Bibasilar dependent pulmonary  consolidation. Hepatobiliary: Gallstones are noted.  Liver is within normal limits. Pancreas: Unremarkable Spleen: Unremarkable. Adrenals/Urinary Tract: Bilateral renal cysts are noted. Most are simple. There is a complex cyst in the interpolar region of the right kidney with a thick heterogeneous rim. It measures 3.1 cm on image number 42 of series 3. Stomach/Bowel: The transverse colon is positioned anterior to the stomach. NG tube is in place with its tip in the body of the stomach. No disproportionate dilatation of bowel to suggest obstruction. Vascular/Lymphatic: Atherosclerotic calcifications of the aorta. No abnormal retroperitoneal adenopathy. Other: There is a small amount of free fluid in the paracolic gutters. Musculoskeletal: No vertebral compression deformity IMPRESSION: The transverse colon is positioned anterior to the stomach. This is problematic, however expansion of the stomach may provide a window. Barium administration for: Opacification is recommended. 3.1 cm complex lesion in the interpolar region of the right kidney. Cystic malignancy cannot be excluded. MRI with and without contrast is recommended. Cholelithiasis. Bilateral pleural effusions. Bibasilar dependent consolidation in an aspiration pattern. Electronically Signed   By: Jolaine ClickArthur  Hoss M.D.   On: 06/26/2018 15:46   Dg Chest 1 View  Result Date: 06/25/2018 CLINICAL DATA:  S/P thoracentesis  right EXAM: CHEST  1 VIEW COMPARISON:  the previous day's study FINDINGS: No pneumothorax. Apparent resolution of the right pleural effusion. Persistent blunting of the left lateral costophrenic angle, possible effusion. Patchy airspace opacities in the lung bases, slightly improved on the right since previous study. Tracheostomy device, enteral tube, and tunneled right IJ hemodialysis catheter stable. Cervical fixation hardware partially visualized. IMPRESSION: 1. No pneumothorax post right thoracentesis. Critical Value/emergent results were sent  at the time of interpretation on 06/25/2018 at 1:15 pm [facility] who electronically acknowledged these results. 2. Bibasilar airspace opacities, improved on the right since prior study. 3.  Support hardware stable in position. Electronically Signed   By: Corlis Leak  Hassell M.D.   On: 06/25/2018 13:16   Ct Chest Wo Contrast  Result Date: 06/26/2018 CLINICAL DATA:  Fever of unknown origin EXAM: CT CHEST WITHOUT CONTRAST TECHNIQUE: Multidetector CT imaging of the chest was performed following the standard protocol without IV contrast. COMPARISON:  Multiple chest radiographs, most recently 06/25/2018 FINDINGS: Cardiovascular: Heart is top-normal in size. Small pericardial effusion. No evidence of thoracic aortic aneurysm. Mild atherosclerotic calcifications of the aortic arch. Three vessel coronary atherosclerosis. Right IJ dual lumen dialysis catheter terminating at the cavoatrial junction. Additional right IJ catheter terminating at the cavoatrial junction. Mediastinum/Nodes: Small mediastinal lymph nodes, including a 10 mm short axis high right paratracheal node and a 14 mm short axis subcarinal node, poorly visualized. Visualized thyroid is unremarkable. Lungs/Pleura: Tracheostomy in satisfactory position. Multifocal patchy opacities in the bilateral upper lobes, suspicious for pneumonia. Additional patchy right lower lobe opacity,  atelectasis versus pneumonia. Patchy left lower lobe opacity, likely compressive atelectasis. Small bilateral pleural effusions. No pneumothorax. Upper Abdomen: Visualized upper abdomen is notable for an enteric tube coursing into the stomach. Musculoskeletal: Cervical spine fixation hardware, incompletely visualized. Thoracic spine is within normal limits. IMPRESSION: Multifocal patchy opacities in the bilateral upper lobes, suspicious for pneumonia. Additional patchy right lower lobe opacity, atelectasis versus pneumonia. Small bilateral pleural effusions. Associated compressive  atelectasis in the left lower lobe. Support apparatus as above. Aortic Atherosclerosis (ICD10-I70.0). Electronically Signed   By: Charline Bills M.D.   On: 06/26/2018 02:08   Dg Chest Port 1 View  Result Date: 06/25/2018 CLINICAL DATA:  Central line placement EXAM: PORTABLE CHEST 1 VIEW COMPARISON:  06/25/2018 FINDINGS: Right jugular central venous catheter tip at the cavoatrial junction. Dual-lumen dialysis catheter unchanged also in the SVC. Tracheostomy in good position. No pneumothorax. NG in the stomach. Bibasilar airspace disease unchanged. IMPRESSION: New right jugular central venous catheter tip at the cavoatrial junction. No pneumothorax Moderate bibasilar atelectasis/infiltrate unchanged. Electronically Signed   By: Marlan Palau M.D.   On: 06/25/2018 18:23   Dg Chest Port 1 View  Result Date: 06/24/2018 CLINICAL DATA:  72 year old male with a history of respiratory failure EXAM: PORTABLE CHEST 1 VIEW COMPARISON:  06/16/2018, 06/13/2018, 05/29/2018 FINDINGS: Cardiomediastinal silhouette unchanged in size and contour. Heart borders partially obscured by overlying lung/pleural disease. Interval extubation with surgical tracheostomy, projecting over the midline. Unchanged gastric tube. Unchanged right IJ approach tunneled hemodialysis catheter, with the tip appearing to terminate superior vena cava. Similar appearance of right greater than left basilar opacities obscuring the hemidiaphragm and heart borders. No pneumothorax.  Interlobular septal thickening. IMPRESSION: Similar appearance of the chest x-ray with right greater than left mixed interstitial and airspace opacities, with right greater than left pleural effusion. Interval extubation with surgical tracheostomy. Unchanged gastric tube. Unchanged right IJ tunneled hemodialysis catheter Electronically Signed   By: Gilmer Mor D.O.   On: 06/24/2018 07:33   Dg Chest Port 1 View  Result Date: 06/16/2018 CLINICAL DATA:  Fever EXAM:  PORTABLE CHEST 1 VIEW COMPARISON:  06/13/2018 FINDINGS: Endotracheal tube, NG tube, and central venous line unchanged. Stable cardiac silhouette. LEFT lung is clear. RIGHT lung airspace disease slightly improved. Similar RIGHT effusion. IMPRESSION: 1. Stable support apparatus. 2. Improved airspace disease in the RIGHT lower lobe. 3. Persistent RIGHT effusion. Electronically Signed   By: Genevive Bi M.D.   On: 06/16/2018 11:55   Dg Chest Port 1 View  Result Date: 06/13/2018 CLINICAL DATA:  Desaturation EXAM: PORTABLE CHEST 1 VIEW COMPARISON:  05/27/2018 FINDINGS: Endotracheal tube with the tip 3.1 cm above the carina. Nasogastric tube coursing below the diaphragm. Dual lumen right-sided central venous catheter projecting over the SVC. There is a small right pleural effusion. There is interstitial and alveolar airspace opacities throughout the right lung. The left lung is clear. There is no left pleural effusion. There is no pneumothorax. There is no focal parenchymal opacity. The heart and mediastinal contours are unremarkable. The osseous structures are unremarkable. IMPRESSION: 1. Right pleural effusion with right lung interstitial and alveolar airspace opacities which may reflect pneumonia versus asymmetric pulmonary edema. 2. Support lines and tubing in satisfactory position. Electronically Signed   By: Elige Ko   On: 06/13/2018 11:36   Dg Chest Port 1 View  Result Date: 06/17/2018 CLINICAL DATA:  72 year old male with respiratory failure. EXAM: PORTABLE CHEST 1 VIEW COMPARISON:  None. FINDINGS: Portable AP semi upright view at 1558 hours.  Endotracheal tube tip projects over the tracheal air column between the level of the clavicles and carina. Enteric tube courses to the abdomen, side hole projects at the gastric body level. There is a tunneled type right IJ approach dual lumen dialysis type catheter in place. Mediastinal contours are within normal limits. The left lung appears clear. There is  veiling opacity about the right hilum and lung base. No pneumothorax. Normal pulmonary vascularity. Paucity of bowel gas in the upper abdomen. IMPRESSION: 1. Lines and tubes in place as above. 2. Veiling opacity about the right hilum and lung base could represent a combination of: Atelectasis, pleural effusion, consolidation. Electronically Signed   By: Odessa Fleming M.D.   On: Jun 20, 2018 16:12   Dg Abd Portable 1v  Result Date: 06/04/2018 CLINICAL DATA:  Nasogastric tube placement. EXAM: PORTABLE ABDOMEN - 1 VIEW COMPARISON:  06/20/2018 FINDINGS: Nasogastric tube tip extends below the diaphragm into the proximal stomach. Side hole of the tube lies in the expected location of the gastroesophageal junction. IMPRESSION: 1. Nasogastric tube tip projects in proximal stomach. Electronically Signed   By: Amie Portland M.D.   On: 06/11/2018 10:43   Dg Abd Portable 1v  Result Date: 06/20/18 CLINICAL DATA:  Orogastric tube placement. EXAM: PORTABLE ABDOMEN - 1 VIEW COMPARISON:  None. FINDINGS: The bowel gas pattern is normal. Distal tip of orogastric tube is seen in expected position of proximal stomach. No radio-opaque calculi or other significant radiographic abnormality are seen. IMPRESSION: Distal tip of orogastric tube tip is seen in expected position of proximal stomach. Electronically Signed   By: Lupita Raider, M.D.   On: June 20, 2018 16:12   US Thoracentesis Asp Pleural Space W/img Guide  Result Date: 06/25/2018 INDICATION: Patient with history of respiratory failure, right pleural effusion. Request is made for diagnostic and therapeutic thoracentesis. EXAM: ULTRASOUND GUIDED DIAGNOSTIC AND THERAPEUTIC RIGHT THORACENTESIS MEDICATIONS: 20 mL 1% lidocaine COMPLICATIONS: None immediate. PROCEDURE: An ultrasound guided thoracentesis was thoroughly discussed with the patient and questions answered. The benefits, risks, alternatives and complications were also discussed. The patient understands and wishes to  proceed with the procedure. Written consent was obtained. Ultrasound was performed to localize and mark an adequate pocket of fluid in the right chest. The area was then prepped and draped in the normal sterile fashion. 1% Lidocaine was used for local anesthesia. Under ultrasound guidance a 6 Fr Safe-T-Centesis catheter was introduced. Thoracentesis was performed. The catheter was removed and a dressing applied. FINDINGS: A total of approximately 300 mL of amber/blood-tinged fluid was removed. Samples were sent to the laboratory as requested by the clinical team. IMPRESSION: Successful ultrasound guided diagnostic and therapeutic right thoracentesis yielding 300 mL of pleural fluid. Read by: Loyce Dys PA-C Electronically Signed   By: Jolaine Click M.D.   On: 06/25/2018 13:17    Labs:  CBC: Recent Labs    06/24/18 0653 06/25/18 0634 06/26/18 0713 06/27/18 0456  WBC 22.9* 23.8* 21.2* 16.9*  HGB 7.2* 7.3* 7.4* 7.2*  HCT 23.4* 23.1* 23.4* 23.6*  PLT 334 347 326 333    COAGS: Recent Labs    06/07/18 0415  INR 1.11    BMP: Recent Labs    06/22/18 0540 06/24/18 0653 06/25/18 0634 06/26/18 0713  NA 134* 133* 129*  129* 133*  K 4.2 4.3 4.5  4.5 4.2  CL 95* 96* 93*  90* 97*  CO2 25 22 25  24 26   GLUCOSE 201* 132* 163*  161* 129*  BUN 63* 62*  79*  78* 65*  CALCIUM 8.3* 7.8* 7.7*  7.7* 7.9*  CREATININE 4.26* 4.38* 5.06*  5.07* 4.12*  GFRNONAA 13* 12* 10*  10* 13*  GFRAA 15* 14* 12*  12* 15*    LIVER FUNCTION TESTS: Recent Labs    06/07/18 0415  06/24/18 0653 06/25/18 0634 06/25/18 1020 06/26/18 0713  BILITOT 0.7  --   --   --  1.1  --   AST 44*  --   --   --  49*  --   ALT 40  --   --   --  66*  --   ALKPHOS 112  --   --   --  339*  --   PROT 6.2*  --   --   --  6.4*  --   ALBUMIN 3.1*   < > 2.3* 2.2* 2.3* 2.2*   < > = values in this interval not displayed.    TUMOR MARKERS: No results for input(s): AFPTM, CEA, CA199, CHROMGRNA in the last 8760  hours.  Assessment and Plan:  Resp Failure-- on vent Deconditioning; dysphagia Severe malnutrition Need for long term care Tentatively scheduled for percutaneous gastric tube placement Mon 8/5 Will check wbc and temp Will also check KUB am of procedure-- (barium per NG night before to opacify bowel) Trying to call son for consent  Thank you for this interesting consult.  I greatly enjoyed meeting [employer]. and look forward to participating in their care.  A copy of this report was sent to the requesting provider on this date.  Electronically Signed: Robet Leu, PA-C 06/27/2018, 10:04 AM   I spent a total of 40 Minutes    in face to face in clinical consultation, greater than 50% of which was counseling/coordinating care for percutaneous gastric tube placement

## 2018-06-27 NOTE — Progress Notes (Signed)
[facility] [place]   PULMONARY SERVICE  PROGRESS NOTE  Date of Service: 06/27/2018  Alec Hammedichard Cullins Jr.  ZOX:[id number]  DOB: 13-Sep-1946   DOA: 06/03/2018  Referring Physician: Carron CurieAli Hijazi, MD  HPI: Alec HammedRichard Stinson Jr. is a 72 y.o. male seen for follow up of Acute on Chronic Respiratory Failure.  Patient remains critically ill is on the ventilator has been on 40% oxygen and full support on assist control mode.  Temperature is down after change of antibiotics.  Right now is on triple antibiotic therapy and I would continue with the triple antibiotic therapy until cultures are known and then taper down.  I would still however provide atypical coverage since the addition of Levaquin seems to have helped with the temperatures.  Medications: Reviewed on Rounds  Physical Exam:  Vitals: Temperature 98.5 pulse 84 respiratory 23 blood pressure 1 5474 saturations 100%  Ventilator Settings mode of ventilation assist control FiO2 40% tidal volume 472 PEEP 7  . General: Comfortable at this time . Eyes: Grossly normal lids, irises & conjunctiva . ENT: grossly tongue is normal . Neck: no obvious mass . Cardiovascular: S1 S2 normal no gallop . Respiratory: Coarse rhonchi are noted bilaterally . Abdomen: soft . Skin: no rash seen on limited exam . Musculoskeletal: not rigid . Psychiatric:unable to assess . Neurologic: no seizure no involuntary movements         Lab Data:   Basic Metabolic Panel: Recent Labs  Lab 06/22/18 0540 06/24/18 0653 06/25/18 0634 06/26/18 0713  NA 134* 133* 129*  129* 133*  K 4.2 4.3 4.5  4.5 4.2  CL 95* 96* 93*  90* 97*  CO2 25 22 25  24 26   GLUCOSE 201* 132* 163*  161* 129*  BUN 63* 62* 79*  78* 65*  CREATININE 4.26* 4.38* 5.06*  5.07* 4.12*  CALCIUM 8.3* 7.8* 7.7*  7.7* 7.9*  MG 2.5* 2.5* 2.6*  --   PHOS 4.0 3.3 4.1 3.1    Liver Function Tests: Recent Labs  Lab 06/22/18 0540 06/24/18 0653  06/25/18 0634 06/25/18 1020 06/26/18 0713  AST  --   --   --  49*  --   ALT  --   --   --  66*  --   ALKPHOS  --   --   --  339*  --   BILITOT  --   --   --  1.1  --   PROT  --   --   --  6.4*  --   ALBUMIN 2.5* 2.3* 2.2* 2.3* 2.2*   No results for input(s): LIPASE, AMYLASE in the last 168 hours. Recent Labs  Lab 06/26/18 0713  AMMONIA 40*    CBC: Recent Labs  Lab 06/22/18 0540 06/24/18 0653 06/25/18 0634 06/26/18 0713 06/27/18 0456  WBC 27.5* 22.9* 23.8* 21.2* 16.9*  HGB 7.7* 7.2* 7.3* 7.4* 7.2*  HCT 24.6* 23.4* 23.1* 23.4* 23.6*  MCV 97.2 97.9 95.5 95.5 96.7  PLT 334 334 347 326 333    Cardiac Enzymes: No results for input(s): CKTOTAL, CKMB, CKMBINDEX, TROPONINI in the last 168 hours.  BNP (last 3 results) No results for input(s): BNP in the last 8760 hours.  ProBNP (last 3 results) No results for input(s): PROBNP in the last 8760 hours.  Radiological Exams: Ct Abdomen Wo Contrast  Result Date: 06/26/2018 CLINICAL DATA:  Gastrostomy tube evaluation EXAM: CT ABDOMEN WITHOUT CONTRAST TECHNIQUE: Multidetector CT imaging of the abdomen was performed following the  standard protocol without IV contrast. COMPARISON:  None. FINDINGS: Lower chest: Bilateral pleural effusions. Bibasilar dependent pulmonary consolidation. Hepatobiliary: Gallstones are noted.  Liver is within normal limits. Pancreas: Unremarkable Spleen: Unremarkable. Adrenals/Urinary Tract: Bilateral renal cysts are noted. Most are simple. There is a complex cyst in the interpolar region of the right kidney with a thick heterogeneous rim. It measures 3.1 cm on image number 42 of series 3. Stomach/Bowel: The transverse colon is positioned anterior to the stomach. NG tube is in place with its tip in the body of the stomach. No disproportionate dilatation of bowel to suggest obstruction. Vascular/Lymphatic: Atherosclerotic calcifications of the aorta. No abnormal retroperitoneal adenopathy. Other: There is a small  amount of free fluid in the paracolic gutters. Musculoskeletal: No vertebral compression deformity IMPRESSION: The transverse colon is positioned anterior to the stomach. This is problematic, however expansion of the stomach may provide a window. Barium administration for: Opacification is recommended. 3.1 cm complex lesion in the interpolar region of the right kidney. Cystic malignancy cannot be excluded. MRI with and without contrast is recommended. Cholelithiasis. Bilateral pleural effusions. Bibasilar dependent consolidation in an aspiration pattern. Electronically Signed   By: Jolaine Click M.D.   On: 06/26/2018 15:46   Ct Chest Wo Contrast  Result Date: 06/26/2018 CLINICAL DATA:  Fever of unknown origin EXAM: CT CHEST WITHOUT CONTRAST TECHNIQUE: Multidetector CT imaging of the chest was performed following the standard protocol without IV contrast. COMPARISON:  Multiple chest radiographs, most recently 06/25/2018 FINDINGS: Cardiovascular: Heart is top-normal in size. Small pericardial effusion. No evidence of thoracic aortic aneurysm. Mild atherosclerotic calcifications of the aortic arch. Three vessel coronary atherosclerosis. Right IJ dual lumen dialysis catheter terminating at the cavoatrial junction. Additional right IJ catheter terminating at the cavoatrial junction. Mediastinum/Nodes: Small mediastinal lymph nodes, including a 10 mm short axis high right paratracheal node and a 14 mm short axis subcarinal node, poorly visualized. Visualized thyroid is unremarkable. Lungs/Pleura: Tracheostomy in satisfactory position. Multifocal patchy opacities in the bilateral upper lobes, suspicious for pneumonia. Additional patchy right lower lobe opacity, atelectasis versus pneumonia. Patchy left lower lobe opacity, likely compressive atelectasis. Small bilateral pleural effusions. No pneumothorax. Upper Abdomen: Visualized upper abdomen is notable for an enteric tube coursing into the stomach. Musculoskeletal:  Cervical spine fixation hardware, incompletely visualized. Thoracic spine is within normal limits. IMPRESSION: Multifocal patchy opacities in the bilateral upper lobes, suspicious for pneumonia. Additional patchy right lower lobe opacity, atelectasis versus pneumonia. Small bilateral pleural effusions. Associated compressive atelectasis in the left lower lobe. Support apparatus as above. Aortic Atherosclerosis (ICD10-I70.0). Electronically Signed   By: Charline Bills M.D.   On: 06/26/2018 02:08   Dg Chest Port 1 View  Result Date: 06/25/2018 CLINICAL DATA:  Central line placement EXAM: PORTABLE CHEST 1 VIEW COMPARISON:  06/25/2018 FINDINGS: Right jugular central venous catheter tip at the cavoatrial junction. Dual-lumen dialysis catheter unchanged also in the SVC. Tracheostomy in good position. No pneumothorax. NG in the stomach. Bibasilar airspace disease unchanged. IMPRESSION: New right jugular central venous catheter tip at the cavoatrial junction. No pneumothorax Moderate bibasilar atelectasis/infiltrate unchanged. Electronically Signed   By: Marlan Palau M.D.   On: 06/25/2018 18:23    Assessment/Plan Active Problems:   Acute on chronic respiratory failure with hypoxia (HCC)   End stage renal disease on dialysis (HCC)   Quadriplegia (HCC)   Seizure disorder (HCC)   Pleural effusion, right   Multifocal pneumonia   ARDS (adult respiratory distress syndrome) (HCC)   1. Acute on chronic  respiratory failure with hypoxia continue on full vent support.  Patient's PEEP will be increased as tolerated try to wean FiO2 down as tolerated. 2. End-stage renal disease on dialysis continue present supportive care. 3. Quadriplegia at baseline. 4. Possible ARDS chest x-ray results as noted showing multi lobar involvement I think more consistent with ARDS. 5. Multifocal pneumonia as above triple antibiotic therapy will be continued. 6. Seizure disorder no active seizures noted at this time.  We will  continue to monitor. 7. Pleural effusion status post thoracentesis  Patient remains critically ill in danger of cardiac arrest.  Review of medications were not discussion with the pharmacist and discussion with the primary care team time spent 35 minutes  I have personally seen and evaluated the patient, evaluated laboratory and imaging results, formulated the assessment and plan and placed orders. The Patient requires high complexity decision making for assessment and support.  Case was discussed on Rounds with the Respiratory Therapy Staff  Yevonne Pax, MD [facility] Pulmonary Critical Care Medicine Sleep Medicine

## 2018-06-27 DEATH — deceased

## 2018-06-28 DIAGNOSIS — J9621 Acute and chronic respiratory failure with hypoxia: Secondary | ICD-10-CM | POA: Diagnosis not present

## 2018-06-28 DIAGNOSIS — J90 Pleural effusion, not elsewhere classified: Secondary | ICD-10-CM | POA: Diagnosis not present

## 2018-06-28 DIAGNOSIS — J181 Lobar pneumonia, unspecified organism: Secondary | ICD-10-CM | POA: Diagnosis not present

## 2018-06-28 DIAGNOSIS — N186 End stage renal disease: Secondary | ICD-10-CM | POA: Diagnosis not present

## 2018-06-28 LAB — CBC
HCT: 23.2 % — ABNORMAL LOW (ref 39.0–52.0)
Hemoglobin: 7.1 g/dL — ABNORMAL LOW (ref 13.0–17.0)
MCH: 29.8 pg (ref 26.0–34.0)
MCHC: 30.6 g/dL (ref 30.0–36.0)
MCV: 97.5 fL (ref 78.0–100.0)
PLATELETS: 321 10*3/uL (ref 150–400)
RBC: 2.38 MIL/uL — AB (ref 4.22–5.81)
RDW: 15.4 % (ref 11.5–15.5)
WBC: 14 10*3/uL — ABNORMAL HIGH (ref 4.0–10.5)

## 2018-06-28 LAB — RENAL FUNCTION PANEL
ANION GAP: 14 (ref 5–15)
Albumin: 2 g/dL — ABNORMAL LOW (ref 3.5–5.0)
BUN: 72 mg/dL — ABNORMAL HIGH (ref 8–23)
CHLORIDE: 97 mmol/L — AB (ref 98–111)
CO2: 25 mmol/L (ref 22–32)
CREATININE: 4.13 mg/dL — AB (ref 0.61–1.24)
Calcium: 7.6 mg/dL — ABNORMAL LOW (ref 8.9–10.3)
GFR calc non Af Amer: 13 mL/min — ABNORMAL LOW (ref >60)
GFR, EST AFRICAN AMERICAN: 15 mL/min — AB (ref >60)
Glucose, Bld: 136 mg/dL — ABNORMAL HIGH (ref 70–99)
Phosphorus: 3.5 mg/dL (ref 2.5–4.6)
Potassium: 4.4 mmol/L (ref 3.5–5.1)
SODIUM: 136 mmol/L (ref 135–145)

## 2018-06-28 LAB — VANCOMYCIN, TROUGH: VANCOMYCIN TR: 25 ug/mL — AB (ref 15–20)

## 2018-06-28 NOTE — Progress Notes (Signed)
Pulmonary Critical Care Medicine [facility] [place]   PULMONARY SERVICE  PROGRESS NOTE  Date of Service: 06/28/2018  Alec Bush.  ZOX:[id number]  DOB: June 17, 1946   DOA: 05/27/2018  Referring Physician: Carron Curie, MD  HPI: Alec Bush. is a 72 y.o. male seen for follow up of Acute on Chronic Respiratory Failure.  Patient remains on the ventilator at this time critically ill.  Has been having issues with being able to wean.  Has not been tolerating weaning currently is on 40% oxygen.  Medications: Reviewed on Rounds  Physical Exam:  Vitals: Temperature 98.2 pulse 93 respiratory rate 24 blood pressure is 147/62 saturations 100%  Ventilator Settings mode of ventilation assist control FiO2 40% tidal volume 471 PEEP 7  . General: Comfortable at this time . Eyes: Grossly normal lids, irises & conjunctiva . ENT: grossly tongue is normal . Neck: no obvious mass . Cardiovascular: S1 S2 normal no gallop . Respiratory: Coarse breath sounds no rhonchi are noted . Abdomen: soft . Skin: no rash seen on limited exam . Musculoskeletal: not rigid . Psychiatric:unable to assess . Neurologic: no seizure no involuntary movements         Lab Data:   Basic Metabolic Panel: Recent Labs  Lab 06/22/18 0540 06/24/18 0653 06/25/18 0634 06/26/18 0713 06/28/18 0515  NA 134* 133* 129*  129* 133* 136  K 4.2 4.3 4.5  4.5 4.2 4.4  CL 95* 96* 93*  90* 97* 97*  CO2 25 22 25  24 26 25   GLUCOSE 201* 132* 163*  161* 129* 136*  BUN 63* 62* 79*  78* 65* 72*  CREATININE 4.26* 4.38* 5.06*  5.07* 4.12* 4.13*  CALCIUM 8.3* 7.8* 7.7*  7.7* 7.9* 7.6*  MG 2.5* 2.5* 2.6*  --   --   PHOS 4.0 3.3 4.1 3.1 3.5    Liver Function Tests: Recent Labs  Lab 06/24/18 0653 06/25/18 0634 06/25/18 1020 06/26/18 0713 06/28/18 0515  AST  --   --  49*  --   --   ALT  --   --  66*  --   --   ALKPHOS  --   --  339*  --   --   BILITOT  --   --  1.1  --   --   PROT  --   --  6.4*  --    --   ALBUMIN 2.3* 2.2* 2.3* 2.2* 2.0*   No results for input(s): LIPASE, AMYLASE in the last 168 hours. Recent Labs  Lab 06/26/18 0713  AMMONIA 40*    CBC: Recent Labs  Lab 06/24/18 0653 06/25/18 0634 06/26/18 0713 06/27/18 0456 06/28/18 0515  WBC 22.9* 23.8* 21.2* 16.9* 14.0*  HGB 7.2* 7.3* 7.4* 7.2* 7.1*  HCT 23.4* 23.1* 23.4* 23.6* 23.2*  MCV 97.9 95.5 95.5 96.7 97.5  PLT 334 347 326 333 321    Cardiac Enzymes: No results for input(s): CKTOTAL, CKMB, CKMBINDEX, TROPONINI in the last 168 hours.  BNP (last 3 results) No results for input(s): BNP in the last 8760 hours.  ProBNP (last 3 results) No results for input(s): PROBNP in the last 8760 hours.  Radiological Exams: Ct Abdomen Wo Contrast  Result Date: 06/26/2018 CLINICAL DATA:  Gastrostomy tube evaluation EXAM: CT ABDOMEN WITHOUT CONTRAST TECHNIQUE: Multidetector CT imaging of the abdomen was performed following the standard protocol without IV contrast. COMPARISON:  None. FINDINGS: Lower chest: Bilateral pleural effusions. Bibasilar dependent pulmonary consolidation. Hepatobiliary: Gallstones are noted.  Liver is  within normal limits. Pancreas: Unremarkable Spleen: Unremarkable. Adrenals/Urinary Tract: Bilateral renal cysts are noted. Most are simple. There is a complex cyst in the interpolar region of the right kidney with a thick heterogeneous rim. It measures 3.1 cm on image number 42 of series 3. Stomach/Bowel: The transverse colon is positioned anterior to the stomach. NG tube is in place with its tip in the body of the stomach. No disproportionate dilatation of bowel to suggest obstruction. Vascular/Lymphatic: Atherosclerotic calcifications of the aorta. No abnormal retroperitoneal adenopathy. Other: There is a small amount of free fluid in the paracolic gutters. Musculoskeletal: No vertebral compression deformity IMPRESSION: The transverse colon is positioned anterior to the stomach. This is problematic, however  expansion of the stomach may provide a window. Barium administration for: Opacification is recommended. 3.1 cm complex lesion in the interpolar region of the right kidney. Cystic malignancy cannot be excluded. MRI with and without contrast is recommended. Cholelithiasis. Bilateral pleural effusions. Bibasilar dependent consolidation in an aspiration pattern. Electronically Signed   By: Jolaine ClickArthur  Hoss M.D.   On: 06/26/2018 15:46    Assessment/Plan Active Problems:   Acute on chronic respiratory failure with hypoxia (HCC)   End stage renal disease on dialysis (HCC)   Quadriplegia (HCC)   Seizure disorder (HCC)   Pleural effusion, right   Multifocal pneumonia   ARDS (adult respiratory distress syndrome) (HCC)   1. Acute on chronic respiratory failure with hypoxia we will continue with full support on assist control mode.  Patient's oxygenation is slightly better we will continue to titrate oxygen down as tolerated. 2. End-stage renal disease on dialysis continue with supportive care nephrology is following along 3. Quadriplegia at baseline we will continue present management 4. Seizure disorder no active seizures. 5. Multifocal pneumonia.  Patient's antibiotics will be continued we will monitor response closely.  Discussed with primary care team to adjust antibiotics.  We will continue with Levaquin at this time he is actually defervesced and his white count is improving and at this time we will discontinue the cefepime and need to monitor closely. 6. Probable ARDS monitoring saturations will titrate as tolerated adjust the PEEP as tolerated. 7. Pleural effusion we will continue with full support   I have personally seen and evaluated the patient, evaluated laboratory and imaging results, formulated the assessment and plan and placed orders. The Patient requires high complexity decision making for assessment and support.  Case was discussed on Rounds with the Respiratory Therapy Staff patient is  critically ill in danger of cardiac arrest.  Time spent 35 minutes review of the medications review of the chart discussion with the primary care team  Yevonne PaxSaadat A Rutger Salton, MD [facility] Pulmonary Critical Care Medicine Sleep Medicine

## 2018-06-28 NOTE — Progress Notes (Signed)
[facility]  ROUNDING NOTE   Subjective:  Patient afebrile this morning with a temperature of 98. White count trending down. Continues dialysis on Monday, Wednesday, Friday.   Objective:  Vital signs in last 24 hours:  98 pulse 93 respirations 24 blood pressure 147/62  Physical Exam: General: Critically ill appearing  Head: NG tube in place  Eyes: anicteric  Neck: Tracheostomy  Lungs:  Ventilator assisted, scattered rhonchi  Heart: S1S2 no rubs  Abdomen:  Soft, nontender, bowel sounds present  Extremities: 1+ peripheral edema.  Neurologic: Awake  Skin: No lesions  Access: R IJ permcath    Basic Metabolic Panel: Recent Labs  Lab 06/22/18 0540 06/24/18 0653 06/25/18 0634 06/26/18 0713 06/28/18 0515  NA 134* 133* 129*  129* 133* 136  K 4.2 4.3 4.5  4.5 4.2 4.4  CL 95* 96* 93*  90* 97* 97*  CO2 25 22 25  24 26 25   GLUCOSE 201* 132* 163*  161* 129* 136*  BUN 63* 62* 79*  78* 65* 72*  CREATININE 4.26* 4.38* 5.06*  5.07* 4.12* 4.13*  CALCIUM 8.3* 7.8* 7.7*  7.7* 7.9* 7.6*  MG 2.5* 2.5* 2.6*  --   --   PHOS 4.0 3.3 4.1 3.1 3.5    Liver Function Tests: Recent Labs  Lab 06/24/18 0653 06/25/18 0634 06/25/18 1020 06/26/18 0713 06/28/18 0515  AST  --   --  49*  --   --   ALT  --   --  66*  --   --   ALKPHOS  --   --  339*  --   --   BILITOT  --   --  1.1  --   --   PROT  --   --  6.4*  --   --   ALBUMIN 2.3* 2.2* 2.3* 2.2* 2.0*   No results for input(s): LIPASE, AMYLASE in the last 168 hours. Recent Labs  Lab 06/26/18 0713  AMMONIA 40*    CBC: Recent Labs  Lab 06/24/18 0653 06/25/18 0634 06/26/18 0713 06/27/18 0456 06/28/18 0515  WBC 22.9* 23.8* 21.2* 16.9* 14.0*  HGB 7.2* 7.3* 7.4* 7.2* 7.1*  HCT 23.4* 23.1* 23.4* 23.6* 23.2*  MCV 97.9 95.5 95.5 96.7 97.5  PLT 334 347 326 333 321    Cardiac Enzymes: No results for input(s): CKTOTAL, CKMB, CKMBINDEX, TROPONINI in the last 168 hours.  BNP: Invalid input(s):  POCBNP  CBG: No results for input(s): GLUCAP in the last 168 hours.  Microbiology: Results for orders placed or performed during the hospital encounter of 06/02/2018  Culture, respiratory (NON-Expectorated)     Status: None   Collection Time: 06/05/2018  4:00 PM  Result Value Ref Range Status   Specimen Description TRACHEAL ASPIRATE  Final   Special Requests NONE  Final   Gram Stain   Final    MODERATE WBC PRESENT,BOTH PMN AND MONONUCLEAR RARE GRAM POSITIVE COCCI    Culture   Final    Consistent with normal respiratory flora. Performed at [facility], [address], [place], [place] [address]    Report Status 06/08/2018 FINAL  Final  Culture, Urine     Status: None   Collection Time: 05/28/2018  6:02 PM  Result Value Ref Range Status   Specimen Description URINE, RANDOM  Final   Special Requests NONE  Final   Culture   Final    NO GROWTH Performed at [facility], [address] [place], [place] [address]  Report Status 06/08/2018 FINAL  Final  C Difficile Quick Screen w PCR reflex     Status: None   Collection Time: 06/04/2018  6:10 PM  Result Value Ref Range Status   C Diff antigen NEGATIVE NEGATIVE Final   C Diff toxin NEGATIVE NEGATIVE Final   C Diff interpretation No C. difficile detected.  Final  Culture, respiratory (NON-Expectorated)     Status: None   Collection Time: 06/11/18  1:23 PM  Result Value Ref Range Status   Specimen Description TRACHEAL ASPIRATE  Final   Special Requests NONE  Final   Gram Stain   Final    ABUNDANT WBC PRESENT,BOTH PMN AND MONONUCLEAR ABUNDANT GRAM POSITIVE COCCI IN PAIRS MODERATE BUDDING YEAST SEEN    Culture   Final    Consistent with normal respiratory flora. Performed at [facility], [address], [place], [place] [address]    Report Status 06/13/2018 FINAL  Final  Culture, Urine     Status: None   Collection Time: 06/12/18  6:59 AM  Result Value Ref Range Status   Specimen Description URINE, RANDOM   Final   Special Requests NONE  Final   Culture   Final    NO GROWTH Performed at [facility], [address] [place], [place] [address]    Report Status 06/13/2018 FINAL  Final  Culture, Urine     Status: None   Collection Time: 06/16/18 10:35 AM  Result Value Ref Range Status   Specimen Description URINE, RANDOM  Final   Special Requests NONE  Final   Culture   Final    NO GROWTH Performed at [facility], [address] [place], [place] [address]    Report Status 06/17/2018 FINAL  Final  Culture, blood (routine x 2)     Status: None   Collection Time: 06/16/18 10:39 AM  Result Value Ref Range Status   Specimen Description BLOOD RIGHT ANTECUBITAL  Final   Special Requests   Final    BOTTLES DRAWN AEROBIC ONLY Blood Culture adequate volume   Culture   Final    NO GROWTH 5 DAYS Performed at [facility], [address] [place], [place] [address]    Report Status 06/20/2018 FINAL  Final  Culture, blood (routine x 2)     Status: None   Collection Time: 06/16/18 10:39 AM  Result Value Ref Range Status   Specimen Description BLOOD RIGHT ANTECUBITAL  Final   Special Requests   Final    BOTTLES DRAWN AEROBIC ONLY Blood Culture adequate volume   Culture   Final    NO GROWTH 5 DAYS Performed at [facility], [address], [place], [place] [address]    Report Status 06/13/2018 FINAL  Final  Culture, respiratory (non-expectorated)     Status: None   Collection Time: 06/20/18  7:00 PM  Result Value Ref Range Status   Specimen Description TRACHEAL ASPIRATE  Final   Special Requests NONE  Final   Gram Stain   Final    ABUNDANT WBC PRESENT, PREDOMINANTLY PMN RARE GRAM POSITIVE COCCI    Culture   Final    Consistent with normal respiratory flora. Performed at [facility], [address], [place], [place] [address]    Report Status 06/22/2018 FINAL  Final  C difficile quick scan w PCR reflex     Status: None   Collection Time: 06/23/18 10:26  PM  Result Value Ref  Range Status   C Diff antigen NEGATIVE NEGATIVE Final   C Diff toxin NEGATIVE NEGATIVE Final   C Diff interpretation No C. difficile detected.  Final    Comment: Performed at [facility], [address] [place], [place] [address]  Culture, blood (routine x 2)     Status: None (Preliminary result)   Collection Time: 06/24/18  6:53 AM  Result Value Ref Range Status   Specimen Description BLOOD BLOOD RIGHT FOREARM  Final   Special Requests   Final    BOTTLES DRAWN AEROBIC ONLY Blood Culture results may not be optimal due to an inadequate volume of blood received in culture bottles   Culture   Final    NO GROWTH 4 DAYS Performed at [facility], [address] [place], [place] [address]    Report Status PENDING  Incomplete  Culture, blood (routine x 2)     Status: None (Preliminary result)   Collection Time: 06/24/18  6:53 AM  Result Value Ref Range Status   Specimen Description BLOOD RIGHT HAND  Final   Special Requests   Final    BOTTLES DRAWN AEROBIC ONLY Blood Culture results may not be optimal due to an inadequate volume of blood received in culture bottles   Culture   Final    NO GROWTH 4 DAYS Performed at [facility], [address] [place], [place] [address]    Report Status PENDING  Incomplete  Culture, body fluid-bottle     Status: None (Preliminary result)   Collection Time: 06/25/18 12:06 PM  Result Value Ref Range Status   Specimen Description PLEURAL FLUID  Final   Special Requests RIGHT  Final   Culture   Final    NO GROWTH 3 DAYS Performed at [facility], [address] [place], [place] [address]    Report Status PENDING  Incomplete  Culture, respiratory (non-expectorated)     Status: None (Preliminary result)   Collection Time: 06/27/18 12:41 PM  Result Value Ref Range Status   Specimen Description TRACHEAL ASPIRATE  Final   Special Requests NONE  Final   Gram Stain   Final    MODERATE WBC PRESENT,  PREDOMINANTLY PMN NO ORGANISMS SEEN    Culture   Final    CULTURE REINCUBATED FOR BETTER GROWTH Performed at [facility], [address] [place], [place] [address]    Report Status PENDING  Incomplete    Coagulation Studies: No results for input(s): LABPROT, INR in the last 72 hours.  Urinalysis: No results for input(s): COLORURINE, LABSPEC, PHURINE, GLUCOSEU, HGBUR, BILIRUBINUR, KETONESUR, PROTEINUR, UROBILINOGEN, NITRITE, LEUKOCYTESUR in the last 72 hours.  Invalid input(s): APPERANCEUR    Imaging: Ct Abdomen Wo Contrast  Result Date: 06/26/2018 CLINICAL DATA:  Gastrostomy tube evaluation EXAM: CT ABDOMEN WITHOUT CONTRAST TECHNIQUE: Multidetector CT imaging of the abdomen was performed following the standard protocol without IV contrast. COMPARISON:  None. FINDINGS: Lower chest: Bilateral pleural effusions. Bibasilar dependent pulmonary consolidation. Hepatobiliary: Gallstones are noted.  Liver is within normal limits. Pancreas: Unremarkable Spleen: Unremarkable. Adrenals/Urinary Tract: Bilateral renal cysts are noted. Most are simple. There is a complex cyst in the interpolar region of the right kidney with a thick heterogeneous rim. It measures 3.1 cm on image number 42 of series 3. Stomach/Bowel: The transverse colon is positioned anterior to the stomach. NG tube is in place with its tip in the body of the stomach. No disproportionate dilatation of bowel to suggest  obstruction. Vascular/Lymphatic: Atherosclerotic calcifications of the aorta. No abnormal retroperitoneal adenopathy. Other: There is a small amount of free fluid in the paracolic gutters. Musculoskeletal: No vertebral compression deformity IMPRESSION: The transverse colon is positioned anterior to the stomach. This is problematic, however expansion of the stomach may provide a window. Barium administration for: Opacification is recommended. 3.1 cm complex lesion in the interpolar region of the right kidney. Cystic  malignancy cannot be excluded. MRI with and without contrast is recommended. Cholelithiasis. Bilateral pleural effusions. Bibasilar dependent consolidation in an aspiration pattern. Electronically Signed   By: Jolaine Click M.D.   On: 06/26/2018 15:46     Medications:       Assessment/ Plan:  72 y.o. male with a PMHx of ESRD on HD MWF prior to admission here, seizure disorder, hypertension, anemia chronic kidney disease, secondary hyperparathyroidism, who was admitted to [facility] on 06/05/2018 for ongoing treatment of acute respiratory failure, paraplegia with minimal right arm movement, end-stage renal disease.   1.  ESRD with right internal jugular PermCath.     -Continue dialysis on Monday, Wednesday, Friday schedule.  2. Anemia chronic kidney disease.    Hemoglobin is down a bit to 7.1.  Consider transfusion once hemoglobin becomes 7 or less.  3.  Secondary hyperparathyroidism.    Phosphorus was 3.5 this a.m.  Continue to monitor.  4.  Acute respiratory failure.    Tracheostomy functioning well.  Patient remains on the ventilator at this time.  5.  Fever.  Patient afebrile this morning.  WBC count trending down.  Antibiotic management as per hospitalist and critical care.   LOS: 0 Madason Rauls 8/2/201912:14 PM

## 2018-06-29 DIAGNOSIS — J189 Pneumonia, unspecified organism: Secondary | ICD-10-CM | POA: Diagnosis not present

## 2018-06-29 DIAGNOSIS — N186 End stage renal disease: Secondary | ICD-10-CM | POA: Diagnosis not present

## 2018-06-29 DIAGNOSIS — J9621 Acute and chronic respiratory failure with hypoxia: Secondary | ICD-10-CM | POA: Diagnosis not present

## 2018-06-29 DIAGNOSIS — J80 Acute respiratory distress syndrome: Secondary | ICD-10-CM | POA: Diagnosis not present

## 2018-06-29 LAB — CULTURE, RESPIRATORY W GRAM STAIN: Culture: NORMAL

## 2018-06-29 LAB — CULTURE, BLOOD (ROUTINE X 2)
CULTURE: NO GROWTH
Culture: NO GROWTH

## 2018-06-29 NOTE — Progress Notes (Signed)
Pulmonary Critical Care Medicine [facility] [place]   PULMONARY SERVICE  PROGRESS NOTE  Date of Service: 06/29/2018  Alec Bush.  WUJ:[id number]  DOB: [birth year]   DOA: 06/20/2018  Referring Physician: Carron Curie, MD  HPI: Alec Biglow. is a 72 y.o. male seen for follow up of Acute on Chronic Respiratory Failure.  Comfortable right now without distress patient remains on the ventilator full support patient has no fever noted.  Medications: Reviewed on Rounds  Physical Exam:  Vitals: Temperature 98.4 pulse 88 respiratory rate 33 blood pressure 165/58 saturations 100%  Ventilator Settings mode of ventilation assist control FiO2 40% tidal volume 461 PEEP 5  . General: Comfortable at this time . Eyes: Grossly normal lids, irises & conjunctiva . ENT: grossly tongue is normal . Neck: no obvious mass . Cardiovascular: S1 S2 normal no gallop . Respiratory: No rhonchi or rales are noted at this time . Abdomen: soft . Skin: no rash seen on limited exam . Musculoskeletal: not rigid . Psychiatric:unable to assess . Neurologic: no seizure no involuntary movements         Lab Data:   Basic Metabolic Panel: Recent Labs  Lab 06/24/18 0653 06/25/18 0634 06/26/18 0713 06/28/18 0515  NA 133* 129*  129* 133* 136  K 4.3 4.5  4.5 4.2 4.4  CL 96* 93*  90* 97* 97*  CO2 22 25  24 26 25   GLUCOSE 132* 163*  161* 129* 136*  BUN 62* 79*  78* 65* 72*  CREATININE 4.38* 5.06*  5.07* 4.12* 4.13*  CALCIUM 7.8* 7.7*  7.7* 7.9* 7.6*  MG 2.5* 2.6*  --   --   PHOS 3.3 4.1 3.1 3.5    Liver Function Tests: Recent Labs  Lab 06/24/18 0653 06/25/18 0634 06/25/18 1020 06/26/18 0713 06/28/18 0515  AST  --   --  49*  --   --   ALT  --   --  66*  --   --   ALKPHOS  --   --  339*  --   --   BILITOT  --   --  1.1  --   --   PROT  --   --  6.4*  --   --   ALBUMIN 2.3* 2.2* 2.3* 2.2* 2.0*   No results for input(s): LIPASE, AMYLASE in the last 168 hours. Recent Labs   Lab 06/26/18 0713  AMMONIA 40*    CBC: Recent Labs  Lab 06/24/18 0653 06/25/18 0634 06/26/18 0713 06/27/18 0456 06/28/18 0515  WBC 22.9* 23.8* 21.2* 16.9* 14.0*  HGB 7.2* 7.3* 7.4* 7.2* 7.1*  HCT 23.4* 23.1* 23.4* 23.6* 23.2*  MCV 97.9 95.5 95.5 96.7 97.5  PLT 334 347 326 333 321    Cardiac Enzymes: No results for input(s): CKTOTAL, CKMB, CKMBINDEX, TROPONINI in the last 168 hours.  BNP (last 3 results) No results for input(s): BNP in the last 8760 hours.  ProBNP (last 3 results) No results for input(s): PROBNP in the last 8760 hours.  Radiological Exams: No results found.  Assessment/Plan Active Problems:   Acute on chronic respiratory failure with hypoxia (HCC)   End stage renal disease on dialysis (HCC)   Quadriplegia (HCC)   Seizure disorder (HCC)   Pleural effusion, right   Multifocal pneumonia   ARDS (adult respiratory distress syndrome) (HCC)   1. Acute on chronic respiratory failure with hypoxia we will continue with full vent support at this time continue to assess the spontaneous breathing index. 2.  End-stage renal disease on hemodialysis we will continue 3. Quadriplegia at baseline 4. Seizure disorder no active seizures 5. Multifocal pneumonia treated with antibiotics now no fevers 6. ARDS clinically is improving we will continue to monitor   I have personally seen and evaluated the patient, evaluated laboratory and imaging results, formulated the assessment and plan and placed orders. The Patient requires high complexity decision making for assessment and support.  Case was discussed on Rounds with the Respiratory Therapy Staff  Yevonne PaxSaadat A Ahaan Zobrist, MD [facility] Pulmonary Critical Care Medicine Sleep Medicine

## 2018-06-30 DIAGNOSIS — J80 Acute respiratory distress syndrome: Secondary | ICD-10-CM | POA: Diagnosis not present

## 2018-06-30 DIAGNOSIS — J9621 Acute and chronic respiratory failure with hypoxia: Secondary | ICD-10-CM | POA: Diagnosis not present

## 2018-06-30 DIAGNOSIS — J189 Pneumonia, unspecified organism: Secondary | ICD-10-CM | POA: Diagnosis not present

## 2018-06-30 DIAGNOSIS — N186 End stage renal disease: Secondary | ICD-10-CM | POA: Diagnosis not present

## 2018-06-30 LAB — CBC
HCT: 24.5 % — ABNORMAL LOW (ref 39.0–52.0)
Hemoglobin: 7.5 g/dL — ABNORMAL LOW (ref 13.0–17.0)
MCH: 29.6 pg (ref 26.0–34.0)
MCHC: 30.6 g/dL (ref 30.0–36.0)
MCV: 96.8 fL (ref 78.0–100.0)
PLATELETS: 359 10*3/uL (ref 150–400)
RBC: 2.53 MIL/uL — ABNORMAL LOW (ref 4.22–5.81)
RDW: 15.4 % (ref 11.5–15.5)
WBC: 13.3 10*3/uL — AB (ref 4.0–10.5)

## 2018-06-30 LAB — CULTURE, BODY FLUID W GRAM STAIN -BOTTLE: Culture: NO GROWTH

## 2018-06-30 LAB — CULTURE, BODY FLUID-BOTTLE

## 2018-06-30 NOTE — Progress Notes (Signed)
Pulmonary Critical Care Medicine [facility] [place]   PULMONARY SERVICE  PROGRESS NOTE  Date of Service: 06/30/2018  [name]  ZOX:[id number]  DOB: 1946/01/07   DOA: 2018-06-26  Referring Physician: Carron Curie, MD  HPI: Alec Bush. is a 72 y.o. male seen for follow up of Acute on Chronic Respiratory Failure.  Remains comfortable without distress.  Has been afebrile except for a low-grade fever overnight.  Medications: Reviewed on Rounds  Physical Exam:  Vitals: Temperature 98.1 pulse 93 respiratory rate 22 blood pressure 149/73 saturations 100%  Ventilator Settings mode of ventilation assist control FiO2 40% tidal volume 488 PEEP 7  . General: Comfortable at this time . Eyes: Grossly normal lids, irises & conjunctiva . ENT: grossly tongue is normal . Neck: no obvious mass . Cardiovascular: S1 S2 normal no gallop . Respiratory: No rhonchi or rales are noted at this time. . Abdomen: soft . Skin: no rash seen on limited exam . Musculoskeletal: not rigid . Psychiatric:unable to assess . Neurologic: no seizure no involuntary movements         Lab Data:   Basic Metabolic Panel: Recent Labs  Lab 06/24/18 0653 06/25/18 0634 06/26/18 0713 06/28/18 0515  NA 133* 129*  129* 133* 136  K 4.3 4.5  4.5 4.2 4.4  CL 96* 93*  90* 97* 97*  CO2 22 25  24 26 25   GLUCOSE 132* 163*  161* 129* 136*  BUN 62* 79*  78* 65* 72*  CREATININE 4.38* 5.06*  5.07* 4.12* 4.13*  CALCIUM 7.8* 7.7*  7.7* 7.9* 7.6*  MG 2.5* 2.6*  --   --   PHOS 3.3 4.1 3.1 3.5    Liver Function Tests: Recent Labs  Lab 06/24/18 0653 06/25/18 0634 06/25/18 1020 06/26/18 0713 06/28/18 0515  AST  --   --  49*  --   --   ALT  --   --  66*  --   --   ALKPHOS  --   --  339*  --   --   BILITOT  --   --  1.1  --   --   PROT  --   --  6.4*  --   --   ALBUMIN 2.3* 2.2* 2.3* 2.2* 2.0*   No results for input(s): LIPASE, AMYLASE in the last 168 hours. Recent Labs  Lab  06/26/18 0713  AMMONIA 40*    CBC: Recent Labs  Lab 06/25/18 0634 06/26/18 0713 06/27/18 0456 06/28/18 0515 06/30/18 0617  WBC 23.8* 21.2* 16.9* 14.0* 13.3*  HGB 7.3* 7.4* 7.2* 7.1* 7.5*  HCT 23.1* 23.4* 23.6* 23.2* 24.5*  MCV 95.5 95.5 96.7 97.5 96.8  PLT 347 326 333 321 359    Cardiac Enzymes: No results for input(s): CKTOTAL, CKMB, CKMBINDEX, TROPONINI in the last 168 hours.  BNP (last 3 results) No results for input(s): BNP in the last 8760 hours.  ProBNP (last 3 results) No results for input(s): PROBNP in the last 8760 hours.  Radiological Exams: No results found.  Assessment/Plan Active Problems:   Acute on chronic respiratory failure with hypoxia (HCC)   End stage renal disease on dialysis (HCC)   Quadriplegia (HCC)   Seizure disorder (HCC)   Pleural effusion, right   Multifocal pneumonia   ARDS (adult respiratory distress syndrome) (HCC)   1. Acute on chronic respiratory failure with hypoxia patient continues on assist control mode currently 40% FiO2.  We will try to titrate oxygen down and PEEP down as  tolerated.  Mechanics are poor patient is not able to wean at this time. 2. End-stage renal disease on hemodialysis continue supportive care 3. Quadriplegia at baseline we will monitor 4. Seizure disorder no active seizures 5. Pleural effusion status post thoracentesis 6. Multifocal pneumonia treated with antibiotics seems to be improving 7. ARDS clinically improving   I have personally seen and evaluated the patient, evaluated laboratory and imaging results, formulated the assessment and plan and placed orders. The Patient requires high complexity decision making for assessment and support.  Case was discussed on Rounds with the Respiratory Therapy Staff  Yevonne PaxSaadat A Anzley Dibbern, MD [facility] Pulmonary Critical Care Medicine Sleep Medicine

## 2018-07-01 ENCOUNTER — Other Ambulatory Visit (HOSPITAL_COMMUNITY): Payer: Medicare Other

## 2018-07-01 ENCOUNTER — Encounter (HOSPITAL_COMMUNITY): Payer: Self-pay | Admitting: Interventional Radiology

## 2018-07-01 ENCOUNTER — Institutional Professional Consult (permissible substitution) (HOSPITAL_COMMUNITY): Payer: Medicare Other

## 2018-07-01 DIAGNOSIS — N186 End stage renal disease: Secondary | ICD-10-CM | POA: Diagnosis not present

## 2018-07-01 DIAGNOSIS — J80 Acute respiratory distress syndrome: Secondary | ICD-10-CM | POA: Diagnosis not present

## 2018-07-01 DIAGNOSIS — J189 Pneumonia, unspecified organism: Secondary | ICD-10-CM | POA: Diagnosis not present

## 2018-07-01 DIAGNOSIS — J9621 Acute and chronic respiratory failure with hypoxia: Secondary | ICD-10-CM | POA: Diagnosis not present

## 2018-07-01 HISTORY — PX: IR GASTROSTOMY TUBE MOD SED: IMG625

## 2018-07-01 LAB — CBC
HCT: 22.4 % — ABNORMAL LOW (ref 39.0–52.0)
Hemoglobin: 7 g/dL — ABNORMAL LOW (ref 13.0–17.0)
MCH: 29.9 pg (ref 26.0–34.0)
MCHC: 31.3 g/dL (ref 30.0–36.0)
MCV: 95.7 fL (ref 78.0–100.0)
PLATELETS: 363 10*3/uL (ref 150–400)
RBC: 2.34 MIL/uL — AB (ref 4.22–5.81)
RDW: 15.5 % (ref 11.5–15.5)
WBC: 14.6 10*3/uL — ABNORMAL HIGH (ref 4.0–10.5)

## 2018-07-01 LAB — RENAL FUNCTION PANEL
Albumin: 1.8 g/dL — ABNORMAL LOW (ref 3.5–5.0)
Anion gap: 13 (ref 5–15)
BUN: 71 mg/dL — ABNORMAL HIGH (ref 8–23)
CALCIUM: 7.7 mg/dL — AB (ref 8.9–10.3)
CO2: 27 mmol/L (ref 22–32)
CREATININE: 4.32 mg/dL — AB (ref 0.61–1.24)
Chloride: 94 mmol/L — ABNORMAL LOW (ref 98–111)
GFR, EST AFRICAN AMERICAN: 14 mL/min — AB (ref >60)
GFR, EST NON AFRICAN AMERICAN: 12 mL/min — AB (ref >60)
Glucose, Bld: 105 mg/dL — ABNORMAL HIGH (ref 70–99)
Phosphorus: 3.8 mg/dL (ref 2.5–4.6)
Potassium: 4.2 mmol/L (ref 3.5–5.1)
Sodium: 134 mmol/L — ABNORMAL LOW (ref 135–145)

## 2018-07-01 LAB — PROTIME-INR
INR: 1.31
Prothrombin Time: 16.2 seconds — ABNORMAL HIGH (ref 11.4–15.2)

## 2018-07-01 LAB — PREPARE RBC (CROSSMATCH)

## 2018-07-01 MED ORDER — IOPAMIDOL (ISOVUE-300) INJECTION 61%
INTRAVENOUS | Status: AC
  Administered 2018-07-01: 15 mL
  Filled 2018-07-01: qty 50

## 2018-07-01 MED ORDER — HYDROCODONE-ACETAMINOPHEN 5-325 MG PO TABS: 1.0000 | ORAL_TABLET | ORAL | Status: DC | PRN

## 2018-07-01 MED ORDER — GLUCAGON HCL RDNA (DIAGNOSTIC) 1 MG IJ SOLR
INTRAMUSCULAR | Status: AC
  Filled 2018-07-01: qty 1

## 2018-07-01 MED ORDER — FENTANYL CITRATE (PF) 100 MCG/2ML IJ SOLN
INTRAMUSCULAR | Status: AC | PRN
  Administered 2018-07-01: 50 ug via INTRAVENOUS

## 2018-07-01 MED ORDER — ONDANSETRON HCL 4 MG/2ML IJ SOLN: 4.0000 mg | INTRAMUSCULAR | Status: DC | PRN

## 2018-07-01 MED ORDER — LIDOCAINE HCL 1 % IJ SOLN
INTRAMUSCULAR | Status: AC
  Filled 2018-07-01: qty 20

## 2018-07-01 MED ORDER — HYDROMORPHONE HCL 1 MG/ML IJ SOLN: 1.0000 mg | INTRAMUSCULAR | Status: DC | PRN

## 2018-07-01 MED ORDER — CEFAZOLIN (ANCEF) 1 G IV SOLR
INTRAVENOUS | Status: AC | PRN
  Administered 2018-07-01: 2 g

## 2018-07-01 MED ORDER — GLUCAGON HCL RDNA (DIAGNOSTIC) 1 MG IJ SOLR
INTRAMUSCULAR | Status: AC | PRN
  Administered 2018-07-01: .5 mg via INTRAVENOUS

## 2018-07-01 MED ORDER — MIDAZOLAM HCL 2 MG/2ML IJ SOLN
INTRAMUSCULAR | Status: AC | PRN
  Administered 2018-07-01: 1 mg via INTRAVENOUS

## 2018-07-01 MED ORDER — LIDOCAINE HCL (PF) 1 % IJ SOLN
INTRAMUSCULAR | Status: AC | PRN
  Administered 2018-07-01: 10 mL

## 2018-07-01 NOTE — Procedures (Signed)
  Procedure: Gastrostomy placement 20f EBL:   minimal Complications:  none immediate  See full dictation in [facility] PACS.  D. Eleazar Kimmey MD Main # [phone] Pager  [phone]    

## 2018-07-01 NOTE — Progress Notes (Signed)
[facility]  ROUNDING NOTE   Subjective:  Patient seen at bedside. Still has intermittent fever. Due for dialysis again today.   Objective:  Vital signs in last 24 hours:  Temperature 98.8 pulse 88 respirations 25 blood pressure 105/50  Physical Exam: General: Critically ill appearing  Head: NG tube in place  Eyes: anicteric  Neck: Tracheostomy  Lungs:  Ventilator assisted, scattered rhonchi  Heart: S1S2 no rubs  Abdomen:  Soft, nontender, bowel sounds present  Extremities: 1+ peripheral edema.  Neurologic: Awake  Skin: No lesions  Access: R IJ permcath    Basic Metabolic Panel: Recent Labs  Lab 06/25/18 0634 06/26/18 0713 06/28/18 0515 07/01/18 0604  NA 129*  129* 133* 136 134*  K 4.5  4.5 4.2 4.4 4.2  CL 93*  90* 97* 97* 94*  CO2 25  24 26 25 27   GLUCOSE 163*  161* 129* 136* 105*  BUN 79*  78* 65* 72* 71*  CREATININE 5.06*  5.07* 4.12* 4.13* 4.32*  CALCIUM 7.7*  7.7* 7.9* 7.6* 7.7*  MG 2.6*  --   --   --   PHOS 4.1 3.1 3.5 3.8    Liver Function Tests: Recent Labs  Lab 06/25/18 0634 06/25/18 1020 06/26/18 0713 06/28/18 0515 07/01/18 0604  AST  --  49*  --   --   --   ALT  --  66*  --   --   --   ALKPHOS  --  339*  --   --   --   BILITOT  --  1.1  --   --   --   PROT  --  6.4*  --   --   --   ALBUMIN 2.2* 2.3* 2.2* 2.0* 1.8*   No results for input(s): LIPASE, AMYLASE in the last 168 hours. Recent Labs  Lab 06/26/18 0713  AMMONIA 40*    CBC: Recent Labs  Lab 06/26/18 0713 06/27/18 0456 06/28/18 0515 06/30/18 0617 07/01/18 0626  WBC 21.2* 16.9* 14.0* 13.3* 14.6*  HGB 7.4* 7.2* 7.1* 7.5* 7.0*  HCT 23.4* 23.6* 23.2* 24.5* 22.4*  MCV 95.5 96.7 97.5 96.8 95.7  PLT 326 333 321 359 363    Cardiac Enzymes: No results for input(s): CKTOTAL, CKMB, CKMBINDEX, TROPONINI in the last 168 hours.  BNP: Invalid input(s): POCBNP  CBG: No results for input(s): GLUCAP in the last 168 hours.  Microbiology: Results for orders  placed or performed during the hospital encounter of 06/17/18  Culture, respiratory (NON-Expectorated)     Status: None   Collection Time: 2018/06/17  4:00 PM  Result Value Ref Range Status   Specimen Description TRACHEAL ASPIRATE  Final   Special Requests NONE  Final   Gram Stain   Final    MODERATE WBC PRESENT,BOTH PMN AND MONONUCLEAR RARE GRAM POSITIVE COCCI    Culture   Final    Consistent with normal respiratory flora. Performed at [facility], [address] [place], [place] [address]    Report Status 06/08/2018 FINAL  Final  Culture, Urine     Status: None   Collection Time: 06/17/18  6:02 PM  Result Value Ref Range Status   Specimen Description URINE, RANDOM  Final   Special Requests NONE  Final   Culture   Final    NO GROWTH Performed at [facility], [address] [place], [place] [address]    Report Status 06/08/2018 FINAL  Final  C Difficile Quick Screen w PCR  reflex     Status: None   Collection Time: 06/16/2018  6:10 PM  Result Value Ref Range Status   C Diff antigen NEGATIVE NEGATIVE Final   C Diff toxin NEGATIVE NEGATIVE Final   C Diff interpretation No C. difficile detected.  Final  Culture, respiratory (NON-Expectorated)     Status: None   Collection Time: 06/11/18  1:23 PM  Result Value Ref Range Status   Specimen Description TRACHEAL ASPIRATE  Final   Special Requests NONE  Final   Gram Stain   Final    ABUNDANT WBC PRESENT,BOTH PMN AND MONONUCLEAR ABUNDANT GRAM POSITIVE COCCI IN PAIRS MODERATE BUDDING YEAST SEEN    Culture   Final    Consistent with normal respiratory flora. Performed at [facility], [address], [place], [place] [address]    Report Status 06/13/2018 FINAL  Final  Culture, Urine     Status: None   Collection Time: 06/12/18  6:59 AM  Result Value Ref Range Status   Specimen Description URINE, RANDOM  Final   Special Requests NONE  Final   Culture   Final    NO GROWTH Performed at [facility],  [address], [place], [place] [address]    Report Status 06/13/2018 FINAL  Final  Culture, Urine     Status: None   Collection Time: 06/16/18 10:35 AM  Result Value Ref Range Status   Specimen Description URINE, RANDOM  Final   Special Requests NONE  Final   Culture   Final    NO GROWTH Performed at [facility], [address] [place], [place] [address]    Report Status 06/17/2018 FINAL  Final  Culture, blood (routine x 2)     Status: None   Collection Time: 06/16/18 10:39 AM  Result Value Ref Range Status   Specimen Description BLOOD RIGHT ANTECUBITAL  Final   Special Requests   Final    BOTTLES DRAWN AEROBIC ONLY Blood Culture adequate volume   Culture   Final    NO GROWTH 5 DAYS Performed at [facility], [address], [place], [place] [address]    Report Status 06/20/2018 FINAL  Final  Culture, blood (routine x 2)     Status: None   Collection Time: 06/16/18 10:39 AM  Result Value Ref Range Status   Specimen Description BLOOD RIGHT ANTECUBITAL  Final   Special Requests   Final    BOTTLES DRAWN AEROBIC ONLY Blood Culture adequate volume   Culture   Final    NO GROWTH 5 DAYS Performed at [facility], [address] [place], [place] [address]    Report Status 06/26/2018 FINAL  Final  Culture, respiratory (non-expectorated)     Status: None   Collection Time: 06/20/18  7:00 PM  Result Value Ref Range Status   Specimen Description TRACHEAL ASPIRATE  Final   Special Requests NONE  Final   Gram Stain   Final    ABUNDANT WBC PRESENT, PREDOMINANTLY PMN RARE GRAM POSITIVE COCCI    Culture   Final    Consistent with normal respiratory flora. Performed at [facility], [address], [place], [place] [address]    Report Status 06/22/2018 FINAL  Final  C difficile quick scan w PCR reflex     Status: None   Collection Time: 06/23/18 10:26 PM  Result Value Ref Range Status   C Diff antigen NEGATIVE NEGATIVE Final   C Diff  toxin NEGATIVE NEGATIVE  Final   C Diff interpretation No C. difficile detected.  Final    Comment: Performed at [facility], [address] [place], [place] [address]  Culture, blood (routine x 2)     Status: None   Collection Time: 06/24/18  6:53 AM  Result Value Ref Range Status   Specimen Description BLOOD BLOOD RIGHT FOREARM  Final   Special Requests   Final    BOTTLES DRAWN AEROBIC ONLY Blood Culture results may not be optimal due to an inadequate volume of blood received in culture bottles   Culture   Final    NO GROWTH 5 DAYS Performed at [facility], [address] [place], [place] [address]    Report Status 06/29/2018 FINAL  Final  Culture, blood (routine x 2)     Status: None   Collection Time: 06/24/18  6:53 AM  Result Value Ref Range Status   Specimen Description BLOOD RIGHT HAND  Final   Special Requests   Final    BOTTLES DRAWN AEROBIC ONLY Blood Culture results may not be optimal due to an inadequate volume of blood received in culture bottles   Culture   Final    NO GROWTH 5 DAYS Performed at [facility], [address] [place], [place] [address]    Report Status 06/29/2018 FINAL  Final  Culture, body fluid-bottle     Status: None   Collection Time: 06/25/18 12:06 PM  Result Value Ref Range Status   Specimen Description PLEURAL FLUID  Final   Special Requests RIGHT  Final   Culture   Final    NO GROWTH 5 DAYS Performed at [facility], [address] [place], [place] [address]    Report Status 06/30/2018 FINAL  Final  Culture, respiratory (non-expectorated)     Status: None   Collection Time: 06/27/18 12:41 PM  Result Value Ref Range Status   Specimen Description TRACHEAL ASPIRATE  Final   Special Requests NONE  Final   Gram Stain   Final    MODERATE WBC PRESENT, PREDOMINANTLY PMN NO ORGANISMS SEEN    Culture   Final    RARE Consistent with normal respiratory flora. Performed at [facility], [address] [place], [place] [address]     Report Status 06/29/2018 FINAL  Final    Coagulation Studies: Recent Labs    07/01/18 0604  LABPROT 16.2*  INR 1.31    Urinalysis: No results for input(s): COLORURINE, LABSPEC, PHURINE, GLUCOSEU, HGBUR, BILIRUBINUR, KETONESUR, PROTEINUR, UROBILINOGEN, NITRITE, LEUKOCYTESUR in the last 72 hours.  Invalid input(s): APPERANCEUR    Imaging: Dg Abd Portable 1v  Result Date: 07/01/2018 CLINICAL DATA:  Evaluate bowel gas pattern prior to administration of barium. EXAM: PORTABLE ABDOMEN - 1 VIEW COMPARISON:  Abdominal CT scan of June 26, 2018 FINDINGS: The esophagogastric tube tip and proximal port project below the expected location of the GE junction. The small and large bowel gas and stool patterns are normal. No contrast is evident. IMPRESSION: Normal bowel gas pattern. The esophagogastric tube tip in proximal port project in the stomach. Electronically Signed   By: David  Swazil[place]Jordan M.D.   On: 07/01/2018 07:50     Medications:       Assessment/ Plan:  72 y.o. male with a PMHx of ESRD on HD MWF prior to admission here, seizure disorder, hypertension, anemia chronic kidney disease, secondary hyperparathyroidism, who was admitted to [facility] on 06/09/2018 for  ongoing treatment of acute respiratory failure, paraplegia with minimal right arm movement, end-stage renal disease.   1.  ESRD with right internal jugular PermCath.     -Patient due for dialysis today.  Orders have been prepared.  2. Anemia chronic kidney disease.    Hemoglobin currently 7.0.  Consider blood transfusion today.  3.  Secondary hyperparathyroidism.    Recheck serum phosphorus today.  4.  Acute respiratory failure.    Patient continues to require ventilatory support using his tracheostomy.  5.  Fever.  Body fluid culture and respiratory culture both negative.  Being treated for pneumonia.   LOS: 0 Armida Vickroy 8/5/20198:26 AM

## 2018-07-01 NOTE — Progress Notes (Signed)
Pulmonary Critical Care Medicine [facility]   PULMONARY SERVICE  PROGRESS NOTE  Date of Service: 07/01/2018  Alec Hammedichard Mesch Jr.  AVW:[id number]  DOB: [birth year]   DOA: Jun 12, 2018  Referring Physician: Carron CurieAli Hijazi, MD  HPI: Alec HammedRichard Grundman Jr. is a 72 y.o. male seen for follow up of Acute on Chronic Respiratory Failure.  Patient had a spontaneous breathing trial and failed right now is on full support and assist control mode.  Patient's been on 35% oxygen with a PEEP of 7  Medications: Reviewed on Rounds  Physical Exam:  Vitals: Temperature 98.8 pulse 88 respiratory rate 25 blood pressure 105/50 saturations 100%  Ventilator Settings mode of ventilation assist control FiO2 35% tidal volume 450 PEEP 7  . General: Comfortable at this time . Eyes: Grossly normal lids, irises & conjunctiva . ENT: grossly tongue is normal . Neck: no obvious mass . Cardiovascular: S1 S2 normal no gallop . Respiratory: No rhonchi or rales are noted at this time . Abdomen: soft . Skin: no rash seen on limited exam . Musculoskeletal: not rigid . Psychiatric:unable to assess . Neurologic: no seizure no involuntary movements         Lab Data:   Basic Metabolic Panel: Recent Labs  Lab 06/25/18 0634 06/26/18 0713 06/28/18 0515 07/01/18 0604  NA 129*  129* 133* 136 134*  K 4.5  4.5 4.2 4.4 4.2  CL 93*  90* 97* 97* 94*  CO2 25  24 26 25 27   GLUCOSE 163*  161* 129* 136* 105*  BUN 79*  78* 65* 72* 71*  CREATININE 5.06*  5.07* 4.12* 4.13* 4.32*  CALCIUM 7.7*  7.7* 7.9* 7.6* 7.7*  MG 2.6*  --   --   --   PHOS 4.1 3.1 3.5 3.8    Liver Function Tests: Recent Labs  Lab 06/25/18 0634 06/25/18 1020 06/26/18 0713 06/28/18 0515 07/01/18 0604  AST  --  49*  --   --   --   ALT  --  66*  --   --   --   ALKPHOS  --  339*  --   --   --   BILITOT  --  1.1  --   --   --   PROT  --  6.4*  --   --   --   ALBUMIN 2.2* 2.3* 2.2* 2.0* 1.8*   No results for input(s): LIPASE,  AMYLASE in the last 168 hours. Recent Labs  Lab 06/26/18 0713  AMMONIA 40*    CBC: Recent Labs  Lab 06/26/18 0713 06/27/18 0456 06/28/18 0515 06/30/18 0617 07/01/18 0626  WBC 21.2* 16.9* 14.0* 13.3* 14.6*  HGB 7.4* 7.2* 7.1* 7.5* 7.0*  HCT 23.4* 23.6* 23.2* 24.5* 22.4*  MCV 95.5 96.7 97.5 96.8 95.7  PLT 326 333 321 359 363    Cardiac Enzymes: No results for input(s): CKTOTAL, CKMB, CKMBINDEX, TROPONINI in the last 168 hours.  BNP (last 3 results) No results for input(s): BNP in the last 8760 hours.  ProBNP (last 3 results) No results for input(s): PROBNP in the last 8760 hours.  Radiological Exams: Dg Abd Portable 1v  Result Date: 07/01/2018 CLINICAL DATA:  Evaluate bowel gas pattern prior to administration of barium. EXAM: PORTABLE ABDOMEN - 1 VIEW COMPARISON:  Abdominal CT scan of June 26, 2018 FINDINGS: The esophagogastric tube tip and proximal port project below the expected location of the GE junction. The small and large bowel gas and stool patterns are normal. No contrast is  evident. IMPRESSION: Normal bowel gas pattern. The esophagogastric tube tip in proximal port project in the stomach. Electronically Signed   By: David  [place] M.D.   On: 07/01/2018 07:50    Assessment/Plan Active Problems:   Acute on chronic respiratory failure with hypoxia (HCC)   End stage renal disease on dialysis (HCC)   Quadriplegia (HCC)   Seizure disorder (HCC)   Pleural effusion, right   Multifocal pneumonia   ARDS (adult respiratory distress syndrome) (HCC)   1. Acute on chronic respiratory failure with hypoxia patient is failed spontaneous breathing index.  We will continue with pulmonary toilet supportive care reassess in the morning. 2. End-stage renal disease on hemodialysis followed by nephrology. 3. Quadriplegia at baseline 4. Seizure disorder no active seizures 5. Right pleural effusion follow-up x-ray as necessary 6. Multifocal pneumonia treated we will continue to  follow 7. ARDS clinically improved   I have personally seen and evaluated the patient, evaluated laboratory and imaging results, formulated the assessment and plan and placed orders. The Patient requires high complexity decision making for assessment and support.  Case was discussed on Rounds with the Respiratory Therapy Staff  Yevonne Pax, MD [facility] Pulmonary Critical Care Medicine Sleep Medicine

## 2018-07-02 ENCOUNTER — Other Ambulatory Visit (HOSPITAL_COMMUNITY): Payer: Medicare Other

## 2018-07-02 DIAGNOSIS — J80 Acute respiratory distress syndrome: Secondary | ICD-10-CM | POA: Diagnosis not present

## 2018-07-02 DIAGNOSIS — J9621 Acute and chronic respiratory failure with hypoxia: Secondary | ICD-10-CM | POA: Diagnosis not present

## 2018-07-02 DIAGNOSIS — J189 Pneumonia, unspecified organism: Secondary | ICD-10-CM | POA: Diagnosis not present

## 2018-07-02 DIAGNOSIS — N186 End stage renal disease: Secondary | ICD-10-CM | POA: Diagnosis not present

## 2018-07-02 LAB — HEMOGLOBIN AND HEMATOCRIT, BLOOD
HCT: 27.4 % — ABNORMAL LOW (ref 39.0–52.0)
Hemoglobin: 8.9 g/dL — ABNORMAL LOW (ref 13.0–17.0)

## 2018-07-02 LAB — TYPE AND SCREEN
ABO/RH(D): B POS
Antibody Screen: NEGATIVE
Unit division: 0

## 2018-07-02 LAB — BPAM RBC
BLOOD PRODUCT EXPIRATION DATE: 201908292359
ISSUE DATE / TIME: 201908051651
Unit Type and Rh: 7300

## 2018-07-02 LAB — HIV ANTIBODY (ROUTINE TESTING W REFLEX): HIV Screen 4th Generation wRfx: NONREACTIVE

## 2018-07-02 NOTE — Progress Notes (Signed)
Pulmonary Critical Care Medicine [facility] [place]   PULMONARY SERVICE  PROGRESS NOTE  Date of Service: 07/02/2018  Alec Bush.  ZOX:[id number]  DOB: [birth year]   DOA: 06/03/2018  Referring Physician: Carron Curie, MD  HPI: Alec Atiyeh. is a 72 y.o. male seen for follow up of Acute on Chronic Respiratory Failure.  Patient is comfortable without distress at this time.  Remains on full support currently is on assist control mode with 28% FiO2 with a PEEP of 5.  Has been doing fairly well as long as he remains on the ventilator has been failing spontaneous breathing trials however  Medications: Reviewed on Rounds  Physical Exam:  Vitals: Temperature 97.6 pulse 87 respiratory 29 blood pressure 162/88 saturations 98%  Ventilator Settings mode of ventilation assist control FiO2 28% tidal volume 467 PEEP 5  . General: Comfortable at this time . Eyes: Grossly normal lids, irises & conjunctiva . ENT: grossly tongue is normal . Neck: no obvious mass . Cardiovascular: S1 S2 normal no gallop . Respiratory: No rhonchi or rales are noted . Abdomen: soft . Skin: no rash seen on limited exam . Musculoskeletal: not rigid . Psychiatric:unable to assess . Neurologic: no seizure no involuntary movements         Lab Data:   Basic Metabolic Panel: Recent Labs  Lab 06/26/18 0713 06/28/18 0515 07/01/18 0604  NA 133* 136 134*  K 4.2 4.4 4.2  CL 97* 97* 94*  CO2 26 25 27   GLUCOSE 129* 136* 105*  BUN 65* 72* 71*  CREATININE 4.12* 4.13* 4.32*  CALCIUM 7.9* 7.6* 7.7*  PHOS 3.1 3.5 3.8    Liver Function Tests: Recent Labs  Lab 06/25/18 1020 06/26/18 0713 06/28/18 0515 07/01/18 0604  AST 49*  --   --   --   ALT 66*  --   --   --   ALKPHOS 339*  --   --   --   BILITOT 1.1  --   --   --   PROT 6.4*  --   --   --   ALBUMIN 2.3* 2.2* 2.0* 1.8*   No results for input(s): LIPASE, AMYLASE in the last 168 hours. Recent Labs  Lab 06/26/18 0713  AMMONIA 40*     CBC: Recent Labs  Lab 06/26/18 0713 06/27/18 0456 06/28/18 0515 06/30/18 0617 07/01/18 0626 07/02/18 0249  WBC 21.2* 16.9* 14.0* 13.3* 14.6*  --   HGB 7.4* 7.2* 7.1* 7.5* 7.0* 8.9*  HCT 23.4* 23.6* 23.2* 24.5* 22.4* 27.4*  MCV 95.5 96.7 97.5 96.8 95.7  --   PLT 326 333 321 359 363  --     Cardiac Enzymes: No results for input(s): CKTOTAL, CKMB, CKMBINDEX, TROPONINI in the last 168 hours.  BNP (last 3 results) No results for input(s): BNP in the last 8760 hours.  ProBNP (last 3 results) No results for input(s): PROBNP in the last 8760 hours.  Radiological Exams: Ir Gastrostomy Tube Mod Sed  Result Date: 07/01/2018 CLINICAL DATA:  Dysphagia.  ARDS.  Needs enteral feeding support. EXAM: PERC PLACEMENT GASTROSTOMY FLUOROSCOPY TIME:  3.9 minutes; 388  uGym2 DAP TECHNIQUE: The procedure, risks, benefits, and alternatives were explained to the family. Questions regarding the procedure were encouraged and answered. The family understands and consents to the procedure. As antibiotic prophylaxis, cefazolin was ordered pre-procedure and administered intravenously within one hour of incision.Progression of previously administered oral barium was confirmed fluoroscopically. A 5 French angiographic catheter was placed as orogastric tube. The  upper abdomen was prepped with Betadine, draped in usual sterile fashion, and infiltrated locally with 1% lidocaine. Intravenous Fentanyl and Versed were administered as conscious sedation during continuous monitoring of the patient's level of consciousness and physiological / cardiorespiratory status by the radiology RN, with a total moderate sedation time of 10 minutes. Stomach was insufflated using air through the orogastric tube. An 7118 French sheath needle was advanced percutaneously into the gastric lumen under fluoroscopy. Gas could be aspirated and a small contrast injection confirmed intraluminal spread. The sheath was exchanged over a guidewire for a  9 [place]French vascular sheath, through which the snare device was advanced and used to snare a guidewire passed through the orogastric tube. This was withdrawn, and the snare attached to the 20 French pull-through gastrostomy tube, which was advanced antegrade, positioned with the internal bumper securing the anterior gastric wall to the anterior abdominal wall. Small contrast injection confirms appropriate positioning. The external bumper was applied and the catheter was flushed. COMPLICATIONS: COMPLICATIONS none IMPRESSION: 1. Technically successful 20 French pull-through gastrostomy placement under fluoroscopy. Electronically Signed   By: Corlis Leak  Hassell M.D.   On: 07/01/2018 16:27   Dg Abd Portable 1v  Result Date: 07/01/2018 CLINICAL DATA:  Evaluate bowel gas pattern prior to administration of barium. EXAM: PORTABLE ABDOMEN - 1 VIEW COMPARISON:  Abdominal CT scan of June 26, 2018 FINDINGS: The esophagogastric tube tip and proximal port project below the expected location of the GE junction. The small and large bowel gas and stool patterns are normal. No contrast is evident. IMPRESSION: Normal bowel gas pattern. The esophagogastric tube tip in proximal port project in the stomach. Electronically Signed   By: David  [place]il[place]Jordan M.D.   On: 07/01/2018 07:50    Assessment/Plan Active Problems:   Acute on chronic respiratory failure with hypoxia (HCC)   End stage renal disease on dialysis (HCC)   Quadriplegia (HCC)   Seizure disorder (HCC)   Pleural effusion, right   Multifocal pneumonia   ARDS (adult respiratory distress syndrome) (HCC)   1. Acute on chronic respiratory failure with hypoxia we will continue with full support on the ventilator.  Respiratory therapy will reassess the spontaneous breathing index once again.  As noted above patient has failed consistently. 2. End-stage renal disease on dialysis continue with supportive care 3. Quadriplegia unchanged  4. seizure disorder unchanged 5. Pleural  effusion at baseline we will continue with supportive care 6. Multifocal pneumonia treated we will follow-up x-ray as necessary   I have personally seen and evaluated the patient, evaluated laboratory and imaging results, formulated the assessment and plan and placed orders. The Patient requires high complexity decision making for assessment and support.  Case was discussed on Rounds with the Respiratory Therapy Staff  Yevonne PaxSaadat A Khan, MD [facility] Pulmonary Critical Care Medicine Sleep Medicine

## 2018-07-03 DIAGNOSIS — J189 Pneumonia, unspecified organism: Secondary | ICD-10-CM | POA: Diagnosis not present

## 2018-07-03 DIAGNOSIS — N186 End stage renal disease: Secondary | ICD-10-CM | POA: Diagnosis not present

## 2018-07-03 DIAGNOSIS — J80 Acute respiratory distress syndrome: Secondary | ICD-10-CM | POA: Diagnosis not present

## 2018-07-03 DIAGNOSIS — J9621 Acute and chronic respiratory failure with hypoxia: Secondary | ICD-10-CM | POA: Diagnosis not present

## 2018-07-03 LAB — RENAL FUNCTION PANEL
ANION GAP: 14 (ref 5–15)
Albumin: 1.8 g/dL — ABNORMAL LOW (ref 3.5–5.0)
BUN: 45 mg/dL — ABNORMAL HIGH (ref 8–23)
CHLORIDE: 93 mmol/L — AB (ref 98–111)
CO2: 26 mmol/L (ref 22–32)
Calcium: 7.7 mg/dL — ABNORMAL LOW (ref 8.9–10.3)
Creatinine, Ser: 3.42 mg/dL — ABNORMAL HIGH (ref 0.61–1.24)
GFR calc Af Amer: 19 mL/min — ABNORMAL LOW (ref >60)
GFR calc non Af Amer: 16 mL/min — ABNORMAL LOW (ref >60)
GLUCOSE: 156 mg/dL — AB (ref 70–99)
POTASSIUM: 3.6 mmol/L (ref 3.5–5.1)
Phosphorus: 2.9 mg/dL (ref 2.5–4.6)
Sodium: 133 mmol/L — ABNORMAL LOW (ref 135–145)

## 2018-07-03 LAB — C DIFFICILE QUICK SCREEN W PCR REFLEX
C DIFFICILE (CDIFF) INTERP: NOT DETECTED
C Diff antigen: NEGATIVE
C Diff toxin: NEGATIVE

## 2018-07-03 LAB — CBC
HCT: 28 % — ABNORMAL LOW (ref 39.0–52.0)
HEMOGLOBIN: 8.7 g/dL — AB (ref 13.0–17.0)
MCH: 29.4 pg (ref 26.0–34.0)
MCHC: 31.1 g/dL (ref 30.0–36.0)
MCV: 94.6 fL (ref 78.0–100.0)
Platelets: 397 10*3/uL (ref 150–400)
RBC: 2.96 MIL/uL — ABNORMAL LOW (ref 4.22–5.81)
RDW: 15.9 % — ABNORMAL HIGH (ref 11.5–15.5)
WBC: 14.8 10*3/uL — ABNORMAL HIGH (ref 4.0–10.5)

## 2018-07-03 NOTE — Progress Notes (Signed)
[facility]  ROUNDING NOTE   Subjective:  Patient due for dialysis later today. Still critically ill. Remains on the ventilator with FiO2 28%. PEG now in place.   Objective:  Vital signs in last 24 hours:  Temperature 97.8 pulse 83 respirations 24 blood pressure 144/70  Physical Exam: General: Critically ill appearing  Head: [place]/AT OM moist  Eyes: anicteric  Neck: Tracheostomy  Lungs:  Ventilator assisted, scattered rhonchi  Heart: S1S2 no rubs  Abdomen:  Soft, nontender, bowel sounds present, PEG in place  Extremities: 1+ peripheral edema.  Neurologic: Awake  Skin: No lesions  Access: R IJ permcath    Basic Metabolic Panel: Recent Labs  Lab 06/28/18 0515 07/01/18 0604 07/03/18 0549  NA 136 134* 133*  K 4.4 4.2 3.6  CL 97* 94* 93*  CO2 25 27 26   GLUCOSE 136* 105* 156*  BUN 72* 71* 45*  CREATININE 4.13* 4.32* 3.42*  CALCIUM 7.6* 7.7* 7.7*  PHOS 3.5 3.8 2.9    Liver Function Tests: Recent Labs  Lab 06/28/18 0515 07/01/18 0604 07/03/18 0549  ALBUMIN 2.0* 1.8* 1.8*   No results for input(s): LIPASE, AMYLASE in the last 168 hours. No results for input(s): AMMONIA in the last 168 hours.  CBC: Recent Labs  Lab 06/27/18 0456 06/28/18 0515 06/30/18 0617 07/01/18 0626 07/02/18 0249 07/03/18 0549  WBC 16.9* 14.0* 13.3* 14.6*  --  14.8*  HGB 7.2* 7.1* 7.5* 7.0* 8.9* 8.7*  HCT 23.6* 23.2* 24.5* 22.4* 27.4* 28.0*  MCV 96.7 97.5 96.8 95.7  --  94.6  PLT 333 321 359 363  --  397    Cardiac Enzymes: No results for input(s): CKTOTAL, CKMB, CKMBINDEX, TROPONINI in the last 168 hours.  BNP: Invalid input(s): POCBNP  CBG: No results for input(s): GLUCAP in the last 168 hours.  Microbiology: Results for orders placed or performed during the hospital encounter of 05/30/2018  Culture, respiratory (NON-Expectorated)     Status: None   Collection Time: 06/03/2018  4:00 PM  Result Value Ref Range Status   Specimen Description TRACHEAL ASPIRATE  Final    Special Requests NONE  Final   Gram Stain   Final    MODERATE WBC PRESENT,BOTH PMN AND MONONUCLEAR RARE GRAM POSITIVE COCCI    Culture   Final    Consistent with normal respiratory flora. Performed at [facility], [address], [place], [place] [address]    Report Status 06/08/2018 FINAL  Final  Culture, Urine     Status: None   Collection Time: 06/05/2018  6:02 PM  Result Value Ref Range Status   Specimen Description URINE, RANDOM  Final   Special Requests NONE  Final   Culture   Final    NO GROWTH Performed at [facility], [address], [place], [place] [address]    Report Status 06/08/2018 FINAL  Final  C Difficile Quick Screen w PCR reflex     Status: None   Collection Time: 06/05/2018  6:10 PM  Result Value Ref Range Status   C Diff antigen NEGATIVE NEGATIVE Final   C Diff toxin NEGATIVE NEGATIVE Final   C Diff interpretation No C. difficile detected.  Final  Culture, respiratory (NON-Expectorated)     Status: None   Collection Time: 06/11/18  1:23 PM  Result Value Ref Range Status   Specimen Description TRACHEAL ASPIRATE  Final   Special Requests NONE  Final   Gram Stain   Final    ABUNDANT WBC  PRESENT,BOTH PMN AND MONONUCLEAR ABUNDANT GRAM POSITIVE COCCI IN PAIRS MODERATE BUDDING YEAST SEEN    Culture   Final    Consistent with normal respiratory flora. Performed at [facility], [address] [place], [place] [address]    Report Status 06/13/2018 FINAL  Final  Culture, Urine     Status: None   Collection Time: 06/12/18  6:59 AM  Result Value Ref Range Status   Specimen Description URINE, RANDOM  Final   Special Requests NONE  Final   Culture   Final    NO GROWTH Performed at [facility], [address], [place], [place] [address]    Report Status 06/13/2018 FINAL  Final  Culture, Urine     Status: None   Collection Time: 06/16/18 10:35 AM  Result Value Ref Range Status   Specimen Description URINE, RANDOM  Final   Special  Requests NONE  Final   Culture   Final    NO GROWTH Performed at [facility], [address] [place], [place] [address]    Report Status 06/17/2018 FINAL  Final  Culture, blood (routine x 2)     Status: None   Collection Time: 06/16/18 10:39 AM  Result Value Ref Range Status   Specimen Description BLOOD RIGHT ANTECUBITAL  Final   Special Requests   Final    BOTTLES DRAWN AEROBIC ONLY Blood Culture adequate volume   Culture   Final    NO GROWTH 5 DAYS Performed at [facility], [address] [place], [place] [address]    Report Status 06/07/2018 FINAL  Final  Culture, blood (routine x 2)     Status: None   Collection Time: 06/16/18 10:39 AM  Result Value Ref Range Status   Specimen Description BLOOD RIGHT ANTECUBITAL  Final   Special Requests   Final    BOTTLES DRAWN AEROBIC ONLY Blood Culture adequate volume   Culture   Final    NO GROWTH 5 DAYS Performed at [facility], [address], [place], [place] [address]    Report Status 06/20/2018 FINAL  Final  Culture, respiratory (non-expectorated)     Status: None   Collection Time: 06/20/18  7:00 PM  Result Value Ref Range Status   Specimen Description TRACHEAL ASPIRATE  Final   Special Requests NONE  Final   Gram Stain   Final    ABUNDANT WBC PRESENT, PREDOMINANTLY PMN RARE GRAM POSITIVE COCCI    Culture   Final    Consistent with normal respiratory flora. Performed at [facility], [address] [place], [place] [address]    Report Status 06/22/2018 FINAL  Final  C difficile quick scan w PCR reflex     Status: None   Collection Time: 06/23/18 10:26 PM  Result Value Ref Range Status   C Diff antigen NEGATIVE NEGATIVE Final   C Diff toxin NEGATIVE NEGATIVE Final   C Diff interpretation No C. difficile detected.  Final    Comment: Performed at [facility], [address] [place], [place] [address]  Culture, blood (routine x 2)     Status: None   Collection Time: 06/24/18  6:53 AM   Result Value Ref Range Status   Specimen Description BLOOD BLOOD RIGHT FOREARM  Final   Special Requests   Final    BOTTLES DRAWN AEROBIC ONLY Blood Culture results may not be optimal due to an inadequate volume of blood received in  culture bottles   Culture   Final    NO GROWTH 5 DAYS Performed at [facility], [address] [place], [place] [address]    Report Status 06/29/2018 FINAL  Final  Culture, blood (routine x 2)     Status: None   Collection Time: 06/24/18  6:53 AM  Result Value Ref Range Status   Specimen Description BLOOD RIGHT HAND  Final   Special Requests   Final    BOTTLES DRAWN AEROBIC ONLY Blood Culture results may not be optimal due to an inadequate volume of blood received in culture bottles   Culture   Final    NO GROWTH 5 DAYS Performed at [facility], [address] [place], [place] [address]    Report Status 06/29/2018 FINAL  Final  Culture, body fluid-bottle     Status: None   Collection Time: 06/25/18 12:06 PM  Result Value Ref Range Status   Specimen Description PLEURAL FLUID  Final   Special Requests RIGHT  Final   Culture   Final    NO GROWTH 5 DAYS Performed at [facility], [address] [place], [place] [address]    Report Status 06/30/2018 FINAL  Final  Culture, respiratory (non-expectorated)     Status: None   Collection Time: 06/27/18 12:41 PM  Result Value Ref Range Status   Specimen Description TRACHEAL ASPIRATE  Final   Special Requests NONE  Final   Gram Stain   Final    MODERATE WBC PRESENT, PREDOMINANTLY PMN NO ORGANISMS SEEN    Culture   Final    RARE Consistent with normal respiratory flora. Performed at [facility], [address] [place], [place] [address]    Report Status 06/29/2018 FINAL  Final    Coagulation Studies: Recent Labs    07/01/18 0604  LABPROT 16.2*  INR 1.31    Urinalysis: No results for input(s): COLORURINE, LABSPEC, PHURINE, GLUCOSEU, HGBUR, BILIRUBINUR, KETONESUR,  PROTEINUR, UROBILINOGEN, NITRITE, LEUKOCYTESUR in the last 72 hours.  Invalid input(s): APPERANCEUR    Imaging: Ir Gastrostomy Tube Mod Sed  Result Date: 07/01/2018 CLINICAL DATA:  Dysphagia.  ARDS.  Needs enteral feeding support. EXAM: PERC PLACEMENT GASTROSTOMY FLUOROSCOPY TIME:  3.9 minutes; 388  uGym2 DAP TECHNIQUE: The procedure, risks, benefits, and alternatives were explained to the family. Questions regarding the procedure were encouraged and answered. The family understands and consents to the procedure. As antibiotic prophylaxis, cefazolin was ordered pre-procedure and administered intravenously within one hour of incision.Progression of previously administered oral barium was confirmed fluoroscopically. A 5 French angiographic catheter was placed as orogastric tube. The upper abdomen was prepped with Betadine, draped in usual sterile fashion, and infiltrated locally with 1% lidocaine. Intravenous Fentanyl and Versed were administered as conscious sedation during continuous monitoring of the patient's level of consciousness and physiological / cardiorespiratory status by the radiology RN, with a total moderate sedation time of 10 minutes. Stomach was insufflated using air through the orogastric tube. An 5218 French sheath needle was advanced percutaneously into the gastric lumen under fluoroscopy. Gas could be aspirated and a small contrast injection confirmed intraluminal spread. The sheath was exchanged over a guidewire for a 9 [place]French vascular sheath, through which the snare device was advanced and used to snare a guidewire passed through the orogastric tube. This was withdrawn, and the snare attached to the 20 French pull-through gastrostomy tube, which was advanced antegrade, positioned with the internal bumper securing the anterior gastric wall to  the anterior abdominal wall. Small contrast injection confirms appropriate positioning. The external bumper was applied and the catheter was flushed.  COMPLICATIONS: COMPLICATIONS none IMPRESSION: 1. Technically successful 20 French pull-through gastrostomy placement under fluoroscopy. Electronically Signed   By: Corlis Leak M.D.   On: 07/01/2018 16:27   Dg Chest Port 1 View  Result Date: 07/02/2018 CLINICAL DATA:  Respiratory failure EXAM: PORTABLE CHEST 1 VIEW COMPARISON:  Chest radiograph June 25, 2018 and chest CT June 26, 2018 FINDINGS: Tracheostomy a tip is 6.7 cm above the carina. Central catheter tips are in the superior vena cava. No pneumothorax. There is a moderate right pleural effusion with much smaller left pleural effusion. There is consolidation in both lung bases, primarily medially. There is patchy atelectasis in each upper lobe. There is questionable airspace opacity in the right upper lobe near the minor fissure, similar to recent study. No new opacity evident. Heart upper normal in size with pulmonary vascularity normal. No adenopathy. There is aortic atherosclerosis. No evident bone lesions. IMPRESSION: Tube and catheter positions as described without pneumothorax. Areas of consolidation in both lower lobes and probable right upper lobe. Effusions bilaterally, larger on the right than on the left. Areas of upper lobe atelectasis bilaterally. Stable cardiac silhouette. There is aortic atherosclerosis. Aortic Atherosclerosis (ICD10-I70.0). Electronically Signed   By: Bretta Bang III M.D.   On: 07/02/2018 09:27     Medications:       Assessment/ Plan:  72 y.o. male with a PMHx of ESRD on HD MWF prior to admission here, seizure disorder, hypertension, anemia chronic kidney disease, secondary hyperparathyroidism, who was admitted to [facility] on 06-27-2018 for ongoing treatment of acute respiratory failure, paraplegia with minimal right arm movement, end-stage renal disease.   1.  ESRD with right internal jugular PermCath.     -Orders for dialysis for today have been prepared.  We plan to complete dialysis today and  next dialysis treatment will be on Friday.  2. Anemia chronic kidney disease.    -Hemoglobin up to 8.7 posttransfusion.  Continue to monitor CBC closely.  3.  Secondary hyperparathyroidism.    Phosphorus 2.9 and at target.  Continue to monitor.  4.  Acute respiratory failure.    Patient continued on ventilatory support.  FiO2 is currently 28% with PEEP of 7.  Weaning per Dr. Welton Flakes.   5.  Fever.  Body fluid culture and respiratory culture both negative.  Being treated for pneumonia.   LOS: 0 Alec Bush 8/7/201911:17 AM

## 2018-07-03 NOTE — Progress Notes (Signed)
Pulmonary Critical Care Medicine [facility]   PULMONARY SERVICE  PROGRESS NOTE  Date of Service: 07/03/2018  Alec Hammedichard Delatorre Jr.  ZOX:[id number]  DOB: 06/10/1946   DOA: 06/05/2018  Referring Physician: Carron CurieAli Hijazi, MD  HPI: Alec HammedRichard Hoeffner Jr. is a 72 y.o. male seen for follow up of Acute on Chronic Respiratory Failure.  Patient is on the ventilator full support had a spontaneous breathing index checked today patient failed was placed back on the ventilator right now is on assist control  Medications: Reviewed on Rounds  Physical Exam:  Vitals: Temperature 97.8 pulse 83 respiratory 24 blood pressure 144/72 saturations 100%  Ventilator Settings mode of ventilation assist control FiO2 28% tidal volume 495 PEEP 7  . General: Comfortable at this time . Eyes: Grossly normal lids, irises & conjunctiva . ENT: grossly tongue is normal . Neck: no obvious mass . Cardiovascular: S1 S2 normal no gallop . Respiratory: No rhonchi or rales are noted at this time . Abdomen: soft . Skin: no rash seen on limited exam . Musculoskeletal: not rigid . Psychiatric:unable to assess . Neurologic: no seizure no involuntary movements         Lab Data:   Basic Metabolic Panel: Recent Labs  Lab 06/28/18 0515 07/01/18 0604 07/03/18 0549  NA 136 134* 133*  K 4.4 4.2 3.6  CL 97* 94* 93*  CO2 25 27 26   GLUCOSE 136* 105* 156*  BUN 72* 71* 45*  CREATININE 4.13* 4.32* 3.42*  CALCIUM 7.6* 7.7* 7.7*  PHOS 3.5 3.8 2.9    Liver Function Tests: Recent Labs  Lab 06/28/18 0515 07/01/18 0604 07/03/18 0549  ALBUMIN 2.0* 1.8* 1.8*   No results for input(s): LIPASE, AMYLASE in the last 168 hours. No results for input(s): AMMONIA in the last 168 hours.  CBC: Recent Labs  Lab 06/27/18 0456 06/28/18 0515 06/30/18 0617 07/01/18 0626 07/02/18 0249 07/03/18 0549  WBC 16.9* 14.0* 13.3* 14.6*  --  14.8*  HGB 7.2* 7.1* 7.5* 7.0* 8.9* 8.7*  HCT 23.6* 23.2* 24.5* 22.4* 27.4* 28.0*   MCV 96.7 97.5 96.8 95.7  --  94.6  PLT 333 321 359 363  --  397    Cardiac Enzymes: No results for input(s): CKTOTAL, CKMB, CKMBINDEX, TROPONINI in the last 168 hours.  BNP (last 3 results) No results for input(s): BNP in the last 8760 hours.  ProBNP (last 3 results) No results for input(s): PROBNP in the last 8760 hours.  Radiological Exams: Dg Chest Port 1 View  Result Date: 07/02/2018 CLINICAL DATA:  Respiratory failure EXAM: PORTABLE CHEST 1 VIEW COMPARISON:  Chest radiograph June 25, 2018 and chest CT June 26, 2018 FINDINGS: Tracheostomy a tip is 6.7 cm above the carina. Central catheter tips are in the superior vena cava. No pneumothorax. There is a moderate right pleural effusion with much smaller left pleural effusion. There is consolidation in both lung bases, primarily medially. There is patchy atelectasis in each upper lobe. There is questionable airspace opacity in the right upper lobe near the minor fissure, similar to recent study. No new opacity evident. Heart upper normal in size with pulmonary vascularity normal. No adenopathy. There is aortic atherosclerosis. No evident bone lesions. IMPRESSION: Tube and catheter positions as described without pneumothorax. Areas of consolidation in both lower lobes and probable right upper lobe. Effusions bilaterally, larger on the right than on the left. Areas of upper lobe atelectasis bilaterally. Stable cardiac silhouette. There is aortic atherosclerosis. Aortic Atherosclerosis (ICD10-I70.0). Electronically Signed   By:  Bretta Bang III M.D.   On: 07/02/2018 09:27    Assessment/Plan Active Problems:   Acute on chronic respiratory failure with hypoxia (HCC)   End stage renal disease on dialysis (HCC)   Quadriplegia (HCC)   Seizure disorder (HCC)   Pleural effusion, right   Multifocal pneumonia   ARDS (adult respiratory distress syndrome) (HCC)   1. Acute on chronic respiratory failure with hypoxia continue with full vent  support continue to assess the spontaneous breathing index.  Continue pulmonary toilet supportive care. 2. End-stage renal disease on dialysis will continue to follow 3. Quadriplegia at baseline 4. Seizure disorder stable we will monitor 5. Right-sided pleural effusion we will continue present management 6. Multifocal pneumonia treated with antibiotics prognosis is guarded   I have personally seen and evaluated the patient, evaluated laboratory and imaging results, formulated the assessment and plan and placed orders. The Patient requires high complexity decision making for assessment and support.  Case was discussed on Rounds with the Respiratory Therapy Staff  Yevonne Pax, MD [facility] Pulmonary Critical Care Medicine Sleep Medicine

## 2018-07-04 DIAGNOSIS — J80 Acute respiratory distress syndrome: Secondary | ICD-10-CM | POA: Diagnosis not present

## 2018-07-04 DIAGNOSIS — N186 End stage renal disease: Secondary | ICD-10-CM | POA: Diagnosis not present

## 2018-07-04 DIAGNOSIS — J9621 Acute and chronic respiratory failure with hypoxia: Secondary | ICD-10-CM | POA: Diagnosis not present

## 2018-07-04 DIAGNOSIS — J189 Pneumonia, unspecified organism: Secondary | ICD-10-CM | POA: Diagnosis not present

## 2018-07-04 NOTE — Progress Notes (Signed)
[facility]ulmonary [facility]   PULMONARY SERVICE  PROGRESS NOTE  Date of Service: 07/04/2018  Carley Hammedichard Nellums Jr.  ZOX:[id number]  DOB: 1946-08-19   DOA: 06/16/2018  Referring Physician: Carron CurieAli Hijazi, MD  HPI: Carley HammedRichard Cena Jr. is a 72 y.o. male seen for follow up of Acute on Chronic Respiratory Failure.  Patient is on full vent support.  Had spontaneous breathing index trial and failed right now is on assist control mode  Medications: Reviewed on Rounds  Physical Exam:  Vitals: Temperature 97.8 pulse 84 respiratory rate 24 blood pressure 142/76 saturations 99%  Ventilator Settings mode of ventilation assist control FiO2 20% tidal volume 417 PEEP 7  . General: Comfortable at this time . Eyes: Grossly normal lids, irises & conjunctiva . ENT: grossly tongue is normal . Neck: no obvious mass . Cardiovascular: S1 S2 normal no gallop . Respiratory: No rhonchi or rales are noted at this time. . Abdomen: soft . Skin: no rash seen on limited exam . Musculoskeletal: not rigid . Psychiatric:unable to assess . Neurologic: no seizure no involuntary movements         Lab Data:   Basic Metabolic Panel: Recent Labs  Lab 06/28/18 0515 07/01/18 0604 07/03/18 0549  NA 136 134* 133*  K 4.4 4.2 3.6  CL 97* 94* 93*  CO2 25 27 26   GLUCOSE 136* 105* 156*  BUN 72* 71* 45*  CREATININE 4.13* 4.32* 3.42*  CALCIUM 7.6* 7.7* 7.7*  PHOS 3.5 3.8 2.9    Liver Function Tests: Recent Labs  Lab 06/28/18 0515 07/01/18 0604 07/03/18 0549  ALBUMIN 2.0* 1.8* 1.8*   No results for input(s): LIPASE, AMYLASE in the last 168 hours. No results for input(s): AMMONIA in the last 168 hours.  CBC: Recent Labs  Lab 06/28/18 0515 06/30/18 0617 07/01/18 0626 07/02/18 0249 07/03/18 0549  WBC 14.0* 13.3* 14.6*  --  14.8*  HGB 7.1* 7.5* 7.0* 8.9* 8.7*  HCT 23.2* 24.5* 22.4* 27.4* 28.0*  MCV 97.5 96.8 95.7  --  94.6  PLT 321 359 363  --  397    Cardiac  Enzymes: No results for input(s): CKTOTAL, CKMB, CKMBINDEX, TROPONINI in the last 168 hours.  BNP (last 3 results) No results for input(s): BNP in the last 8760 hours.  ProBNP (last 3 results) No results for input(s): PROBNP in the last 8760 hours.  Radiological Exams: No results found.  Assessment/Plan Active Problems:   Acute on chronic respiratory failure with hypoxia (HCC)   End stage renal disease on dialysis (HCC)   Quadriplegia (HCC)   Seizure disorder (HCC)   Pleural effusion, right   Multifocal pneumonia   ARDS (adult respiratory distress syndrome) (HCC)   1. Acute on chronic respiratory failure with hypoxia we will continue with assist control titrate oxygen as tolerated.  We will check the spontaneous breathing index again tomorrow 2. Quadriplegia stable at this time no changes noted. 3. Seizure disorder no active seizures noted. 4. Multifocal pneumonia treated with antibiotics we will continue to follow 5. Right-sided pleural effusion at baseline we will continue to follow 6. End-stage renal disease on dialysis followed by nephrology   I have personally seen and evaluated the patient, evaluated laboratory and imaging results, formulated the assessment and plan and placed orders. The Patient requires high complexity decision making for assessment and support.  Case was discussed on Rounds with the Respiratory Therapy Staff  Yevonne PaxSaadat A Zamaria Brazzle, MD [facility] Pulmonary Critical Care Medicine Sleep Medicine

## 2018-07-05 DIAGNOSIS — J189 Pneumonia, unspecified organism: Secondary | ICD-10-CM | POA: Diagnosis not present

## 2018-07-05 DIAGNOSIS — N186 End stage renal disease: Secondary | ICD-10-CM | POA: Diagnosis not present

## 2018-07-05 DIAGNOSIS — J9621 Acute and chronic respiratory failure with hypoxia: Secondary | ICD-10-CM | POA: Diagnosis not present

## 2018-07-05 DIAGNOSIS — J80 Acute respiratory distress syndrome: Secondary | ICD-10-CM | POA: Diagnosis not present

## 2018-07-05 LAB — CBC
HCT: 27.8 % — ABNORMAL LOW (ref 39.0–52.0)
HEMOGLOBIN: 8.7 g/dL — AB (ref 13.0–17.0)
MCH: 29.7 pg (ref 26.0–34.0)
MCHC: 31.3 g/dL (ref 30.0–36.0)
MCV: 94.9 fL (ref 78.0–100.0)
Platelets: 429 10*3/uL — ABNORMAL HIGH (ref 150–400)
RBC: 2.93 MIL/uL — AB (ref 4.22–5.81)
RDW: 15.9 % — ABNORMAL HIGH (ref 11.5–15.5)
WBC: 16.3 10*3/uL — ABNORMAL HIGH (ref 4.0–10.5)

## 2018-07-05 LAB — RENAL FUNCTION PANEL
ANION GAP: 12 (ref 5–15)
Albumin: 1.9 g/dL — ABNORMAL LOW (ref 3.5–5.0)
BUN: 35 mg/dL — ABNORMAL HIGH (ref 8–23)
CALCIUM: 7.9 mg/dL — AB (ref 8.9–10.3)
CO2: 27 mmol/L (ref 22–32)
Chloride: 94 mmol/L — ABNORMAL LOW (ref 98–111)
Creatinine, Ser: 3.1 mg/dL — ABNORMAL HIGH (ref 0.61–1.24)
GFR calc non Af Amer: 18 mL/min — ABNORMAL LOW (ref >60)
GFR, EST AFRICAN AMERICAN: 21 mL/min — AB (ref >60)
Glucose, Bld: 140 mg/dL — ABNORMAL HIGH (ref 70–99)
Phosphorus: 2.2 mg/dL — ABNORMAL LOW (ref 2.5–4.6)
Potassium: 3.5 mmol/L (ref 3.5–5.1)
SODIUM: 133 mmol/L — AB (ref 135–145)

## 2018-07-05 NOTE — Progress Notes (Signed)
Pulmonary Critical Care Medicine [facility]   PULMONARY SERVICE  PROGRESS NOTE  Date of Service: 07/05/2018  Alec Hammedichard Silberman Jr.  [id number]  DOB: Dec 12, 1945   DOA: 06/08/2018  Referring Physician: Carron CurieAli Hijazi, MD  HPI: Alec HammedRichard Janeway Jr. is a 72 y.o. male seen for follow up of Acute on Chronic Respiratory Failure.  Patient remains on full support.  Not tolerating weaning.  He was attempted on the SBI did not tolerate it.  Medications: Reviewed on Rounds  Physical Exam:  Vitals: Temperature 98.5 pulse 99 respiratory 26 blood pressure 114/76 saturations 99%  Ventilator Settings mode of ventilation assist control FiO2 20% tidal volume 472 PEEP 5  . General: Comfortable at this time . Eyes: Grossly normal lids, irises & conjunctiva . ENT: grossly tongue is normal . Neck: no obvious mass . Cardiovascular: S1 S2 normal no gallop . Respiratory: No rhonchi or rales are noted . Abdomen: soft . Skin: no rash seen on limited exam . Musculoskeletal: not rigid . Psychiatric:unable to assess . Neurologic: no seizure no involuntary movements         Lab Data:   Basic Metabolic Panel: Recent Labs  Lab 07/01/18 0604 07/03/18 0549 07/05/18 0613  NA 134* 133* 133*  K 4.2 3.6 3.5  CL 94* 93* 94*  CO2 27 26 27   GLUCOSE 105* 156* 140*  BUN 71* 45* 35*  CREATININE 4.32* 3.42* 3.10*  CALCIUM 7.7* 7.7* 7.9*  PHOS 3.8 2.9 2.2*    Liver Function Tests: Recent Labs  Lab 07/01/18 0604 07/03/18 0549 07/05/18 0613  ALBUMIN 1.8* 1.8* 1.9*   No results for input(s): LIPASE, AMYLASE in the last 168 hours. No results for input(s): AMMONIA in the last 168 hours.  CBC: Recent Labs  Lab 06/30/18 0617 07/01/18 0626 07/02/18 0249 07/03/18 0549 07/05/18 0613  WBC 13.3* 14.6*  --  14.8* 16.3*  HGB 7.5* 7.0* 8.9* 8.7* 8.7*  HCT 24.5* 22.4* 27.4* 28.0* 27.8*  MCV 96.8 95.7  --  94.6 94.9  PLT 359 363  --  397 429*    Cardiac Enzymes: No results for input(s):  CKTOTAL, CKMB, CKMBINDEX, TROPONINI in the last 168 hours.  BNP (last 3 results) No results for input(s): BNP in the last 8760 hours.  ProBNP (last 3 results) No results for input(s): PROBNP in the last 8760 hours.  Radiological Exams: No results found.  Assessment/Plan Active Problems:   Acute on chronic respiratory failure with hypoxia (HCC)   End stage renal disease on dialysis (HCC)   Quadriplegia (HCC)   Seizure disorder (HCC)   Pleural effusion, right   Multifocal pneumonia   ARDS (adult respiratory distress syndrome) (HCC)   1. Acute on chronic respiratory failure with hypoxia we will continue with full vent support as above on assist control with a PEEP of 5.  Continue to check the spontaneous breathing index will continue supportive care. 2. End-stage renal disease on dialysis followed by nephrology prognosis guarded 3. Quadriplegia at baseline we will continue to follow 4. Seizure disorder no active seizures 5. Multifocal pneumonia treated has no fever noted radiologically still showing some consolidation areas.   I have personally seen and evaluated the patient, evaluated laboratory and imaging results, formulated the assessment and plan and placed orders. The Patient requires high complexity decision making for assessment and support.  Case was discussed on Rounds with the Respiratory Therapy Staff  Yevonne PaxSaadat A Zaide Mcclenahan, MD [facility] Pulmonary Critical Care Medicine Sleep Medicine

## 2018-07-05 NOTE — Progress Notes (Signed)
[facility]  ROUNDING NOTE   Subjective:  Patient seen at bedside. Resting comfortably on the ventilator. FiO2 28%.  Objective:  Vital signs in last 24 hours:  Temperature 98 pulse 99 respirations 26 blood pressure 141/76  Physical Exam: General: Critically ill appearing  Head: [place]/AT OM moist  Eyes: anicteric  Neck: Tracheostomy  Lungs:  Ventilator assisted, scattered rhonchi, FiO2 28%  Heart: S1S2 no rubs  Abdomen:  Soft, nontender, bowel sounds present, PEG in place  Extremities: 1+ peripheral edema.  Neurologic: Awake  Skin: No lesions  Access: R IJ permcath    Basic Metabolic Panel: Recent Labs  Lab 07/01/18 0604 07/03/18 0549 07/05/18 0613  NA 134* 133* 133*  K 4.2 3.6 3.5  CL 94* 93* 94*  CO2 27 26 27   GLUCOSE 105* 156* 140*  BUN 71* 45* 35*  CREATININE 4.32* 3.42* 3.10*  CALCIUM 7.7* 7.7* 7.9*  PHOS 3.8 2.9 2.2*    Liver Function Tests: Recent Labs  Lab 07/01/18 0604 07/03/18 0549 07/05/18 0613  ALBUMIN 1.8* 1.8* 1.9*   No results for input(s): LIPASE, AMYLASE in the last 168 hours. No results for input(s): AMMONIA in the last 168 hours.  CBC: Recent Labs  Lab 06/30/18 0617 07/01/18 0626 07/02/18 0249 07/03/18 0549 07/05/18 0613  WBC 13.3* 14.6*  --  14.8* 16.3*  HGB 7.5* 7.0* 8.9* 8.7* 8.7*  HCT 24.5* 22.4* 27.4* 28.0* 27.8*  MCV 96.8 95.7  --  94.6 94.9  PLT 359 363  --  397 429*    Cardiac Enzymes: No results for input(s): CKTOTAL, CKMB, CKMBINDEX, TROPONINI in the last 168 hours.  BNP: Invalid input(s): POCBNP  CBG: No results for input(s): GLUCAP in the last 168 hours.  Microbiology: Results for orders placed or performed during the hospital encounter of 06/09/2018  Culture, respiratory (NON-Expectorated)     Status: None   Collection Time: 06/16/2018  4:00 PM  Result Value Ref Range Status   Specimen Description TRACHEAL ASPIRATE  Final   Special Requests NONE  Final   Gram Stain   Final    MODERATE WBC  PRESENT,BOTH PMN AND MONONUCLEAR RARE GRAM POSITIVE COCCI    Culture   Final    Consistent with normal respiratory flora. Performed at [facility], [address], [place], [place] [address]    Report Status 06/08/2018 FINAL  Final  Culture, Urine     Status: None   Collection Time: 06/12/2018  6:02 PM  Result Value Ref Range Status   Specimen Description URINE, RANDOM  Final   Special Requests NONE  Final   Culture   Final    NO GROWTH Performed at [facility], [address], [place], [place] [address]    Report Status 06/08/2018 FINAL  Final  C Difficile Quick Screen w PCR reflex     Status: None   Collection Time: 06/12/2018  6:10 PM  Result Value Ref Range Status   C Diff antigen NEGATIVE NEGATIVE Final   C Diff toxin NEGATIVE NEGATIVE Final   C Diff interpretation No C. difficile detected.  Final  Culture, respiratory (NON-Expectorated)     Status: None   Collection Time: 06/11/18  1:23 PM  Result Value Ref Range Status   Specimen Description TRACHEAL ASPIRATE  Final   Special Requests NONE  Final   Gram Stain   Final    ABUNDANT WBC PRESENT,BOTH PMN AND MONONUCLEAR ABUNDANT GRAM POSITIVE COCCI IN PAIRS MODERATE BUDDING YEAST SEEN  Culture   Final    Consistent with normal respiratory flora. Performed at [facility], [address] [place], [place] [address]    Report Status 06/13/2018 FINAL  Final  Culture, Urine     Status: None   Collection Time: 06/12/18  6:59 AM  Result Value Ref Range Status   Specimen Description URINE, RANDOM  Final   Special Requests NONE  Final   Culture   Final    NO GROWTH Performed at [facility], [address] [place], [place] [address]    Report Status 06/13/2018 FINAL  Final  Culture, Urine     Status: None   Collection Time: 06/16/18 10:35 AM  Result Value Ref Range Status   Specimen Description URINE, RANDOM  Final   Special Requests NONE  Final   Culture   Final    NO GROWTH Performed at  [facility], [address] [place], [place] [address]    Report Status 06/17/2018 FINAL  Final  Culture, blood (routine x 2)     Status: None   Collection Time: 06/16/18 10:39 AM  Result Value Ref Range Status   Specimen Description BLOOD RIGHT ANTECUBITAL  Final   Special Requests   Final    BOTTLES DRAWN AEROBIC ONLY Blood Culture adequate volume   Culture   Final    NO GROWTH 5 DAYS Performed at [facility], [address], [place], [place] [address]    Report Status 06/20/2018 FINAL  Final  Culture, blood (routine x 2)     Status: None   Collection Time: 06/16/18 10:39 AM  Result Value Ref Range Status   Specimen Description BLOOD RIGHT ANTECUBITAL  Final   Special Requests   Final    BOTTLES DRAWN AEROBIC ONLY Blood Culture adequate volume   Culture   Final    NO GROWTH 5 DAYS Performed at [facility], [address], [place], [place] [address]    Report Status 06/07/2018 FINAL  Final  Culture, respiratory (non-expectorated)     Status: None   Collection Time: 06/20/18  7:00 PM  Result Value Ref Range Status   Specimen Description TRACHEAL ASPIRATE  Final   Special Requests NONE  Final   Gram Stain   Final    ABUNDANT WBC PRESENT, PREDOMINANTLY PMN RARE GRAM POSITIVE COCCI    Culture   Final    Consistent with normal respiratory flora. Performed at [facility], [address] [place], [place] [address]    Report Status 06/22/2018 FINAL  Final  C difficile quick scan w PCR reflex     Status: None   Collection Time: 06/23/18 10:26 PM  Result Value Ref Range Status   C Diff antigen NEGATIVE NEGATIVE Final   C Diff toxin NEGATIVE NEGATIVE Final   C Diff interpretation No C. difficile detected.  Final    Comment: Performed at [facility], [address], [place], [place] [address]  Culture, blood (routine x 2)     Status: None   Collection Time: 06/24/18  6:53 AM  Result Value Ref Range Status   Specimen Description BLOOD BLOOD  RIGHT FOREARM  Final   Special Requests   Final    BOTTLES DRAWN AEROBIC ONLY Blood Culture results may not be optimal due to an inadequate volume of blood received in culture bottles   Culture   Final    NO GROWTH 5 DAYS Performed at  [facility], [address] [place], [place] [address]    Report Status 06/29/2018 FINAL  Final  Culture, blood (routine x 2)     Status: None   Collection Time: 06/24/18  6:53 AM  Result Value Ref Range Status   Specimen Description BLOOD RIGHT HAND  Final   Special Requests   Final    BOTTLES DRAWN AEROBIC ONLY Blood Culture results may not be optimal due to an inadequate volume of blood received in culture bottles   Culture   Final    NO GROWTH 5 DAYS Performed at [facility], [address] [place], [place] [address]    Report Status 06/29/2018 FINAL  Final  Culture, body fluid-bottle     Status: None   Collection Time: 06/25/18 12:06 PM  Result Value Ref Range Status   Specimen Description PLEURAL FLUID  Final   Special Requests RIGHT  Final   Culture   Final    NO GROWTH 5 DAYS Performed at [facility], [address] [place], [place] [address]    Report Status 06/30/2018 FINAL  Final  Culture, respiratory (non-expectorated)     Status: None   Collection Time: 06/27/18 12:41 PM  Result Value Ref Range Status   Specimen Description TRACHEAL ASPIRATE  Final   Special Requests NONE  Final   Gram Stain   Final    MODERATE WBC PRESENT, PREDOMINANTLY PMN NO ORGANISMS SEEN    Culture   Final    RARE Consistent with normal respiratory flora. Performed at [facility], [address] [place], [place] [address]    Report Status 06/29/2018 FINAL  Final  C difficile quick scan w PCR reflex     Status: None   Collection Time: 07/03/18 10:21 AM  Result Value Ref Range Status   C Diff antigen NEGATIVE NEGATIVE Final   C Diff toxin NEGATIVE NEGATIVE Final   C Diff interpretation No C. difficile detected.  Final     Comment: Performed at [facility], [address] [place], [place] [address]    Coagulation Studies: No results for input(s): LABPROT, INR in the last 72 hours.  Urinalysis: No results for input(s): COLORURINE, LABSPEC, PHURINE, GLUCOSEU, HGBUR, BILIRUBINUR, KETONESUR, PROTEINUR, UROBILINOGEN, NITRITE, LEUKOCYTESUR in the last 72 hours.  Invalid input(s): APPERANCEUR    Imaging: No results found.   Medications:       Assessment/ Plan:  72 y.o. male with a PMHx of ESRD on HD MWF prior to admission here, seizure disorder, hypertension, anemia chronic kidney disease, secondary hyperparathyroidism, who was admitted to [facility] on 10-27-18 for ongoing treatment of acute respiratory failure, paraplegia with minimal right arm movement, end-stage renal disease.   1.  ESRD with right internal jugular PermCath.     -Patient due for dialysis again today.  Orders have been prepared.  2. Anemia chronic kidney disease.    -Hemoglobin currently 8.7.  We will continue to monitor.  3.  Secondary hyperparathyroidism.    Serum phosphorus currently 2.2 and a bit lower than before.  We will need to continue to monitor this.  4.  Acute respiratory failure.    Continue ventilatory support.  FiO2 stable at 28%.  5.  Fever.  Patient afebrile at the moment.  Currently on 1.   LOS: 0 Vaidehi Braddy 8/9/20198:26 AM

## 2018-07-06 DIAGNOSIS — J80 Acute respiratory distress syndrome: Secondary | ICD-10-CM | POA: Diagnosis not present

## 2018-07-06 DIAGNOSIS — J189 Pneumonia, unspecified organism: Secondary | ICD-10-CM | POA: Diagnosis not present

## 2018-07-06 DIAGNOSIS — N186 End stage renal disease: Secondary | ICD-10-CM | POA: Diagnosis not present

## 2018-07-06 DIAGNOSIS — J9621 Acute and chronic respiratory failure with hypoxia: Secondary | ICD-10-CM | POA: Diagnosis not present

## 2018-07-06 NOTE — Progress Notes (Signed)
Pulmonary Critical [facility]   PULMONARY SERVICE  PROGRESS NOTE  Date of Service: 07/06/2018  Alec Hammedichard Massmann Jr.  [id number]  DOB: 10-19-1946   DOA: 05/28/2018  Referring Physician: Carron CurieAli Hijazi, MD  HPI: Alec HammedRichard Vandagriff Jr. is a 72 y.o. male seen for follow up of Acute on Chronic Respiratory Failure.  Patient is on full support currently on assist control mode not weaning.  Patient has been requiring 28% FiO2  Medications: Reviewed on Rounds  Physical Exam:  Vitals: Temperature 97.5 pulse 88 respiratory rate 26 blood pressure 161/75 saturations 100%  Ventilator Settings mode of ventilation assist control FiO2 20% tidal 11/[birth year]/1962 PEEP 7  . General: Comfortable at this time . Eyes: Grossly normal lids, irises & conjunctiva . ENT: grossly tongue is normal . Neck: no obvious mass . Cardiovascular: S1 S2 normal no gallop . Respiratory: No rhonchi or rales are noted . Abdomen: soft . Skin: no rash seen on limited exam . Musculoskeletal: not rigid . Psychiatric:unable to assess . Neurologic: no seizure no involuntary movements         Lab Data:   Basic Metabolic Panel: Recent Labs  Lab 07/01/18 0604 07/03/18 0549 07/05/18 0613  NA 134* 133* 133*  K 4.2 3.6 3.5  CL 94* 93* 94*  CO2 27 26 27   GLUCOSE 105* 156* 140*  BUN 71* 45* 35*  CREATININE 4.32* 3.42* 3.10*  CALCIUM 7.7* 7.7* 7.9*  PHOS 3.8 2.9 2.2*    Liver Function Tests: Recent Labs  Lab 07/01/18 0604 07/03/18 0549 07/05/18 0613  ALBUMIN 1.8* 1.8* 1.9*   No results for input(s): LIPASE, AMYLASE in the last 168 hours. No results for input(s): AMMONIA in the last 168 hours.  CBC: Recent Labs  Lab 06/30/18 0617 07/01/18 0626 07/02/18 0249 07/03/18 0549 07/05/18 0613  WBC 13.3* 14.6*  --  14.8* 16.3*  HGB 7.5* 7.0* 8.9* 8.7* 8.7*  HCT 24.5* 22.4* 27.4* 28.0* 27.8*  MCV 96.8 95.7  --  94.6 94.9  PLT 359 363  --  397 429*    Cardiac Enzymes: No results for  input(s): CKTOTAL, CKMB, CKMBINDEX, TROPONINI in the last 168 hours.  BNP (last 3 results) No results for input(s): BNP in the last 8760 hours.  ProBNP (last 3 results) No results for input(s): PROBNP in the last 8760 hours.  Radiological Exams: No results found.  Assessment/Plan Active Problems:   Acute on chronic respiratory failure with hypoxia (HCC)   End stage renal disease on dialysis (HCC)   Quadriplegia (HCC)   Seizure disorder (HCC)   Pleural effusion, right   Multifocal pneumonia   ARDS (adult respiratory distress syndrome) (HCC)   1. Acute on chronic respiratory failure with hypoxia we will continue with full support on assist control mode titrate oxygen as tolerated continue pulmonary toilet and follow 2. Seizure disorder no active seizures noted 3. Quadriplegia at baseline 4. Pleural effusion right-sided we will continue present management 5. Multifocal pneumonia treated follow-up x-rays overall patient still has not been able to do much in the way of weaning   I have personally seen and evaluated the patient, evaluated laboratory and imaging results, formulated the assessment and plan and placed orders. The Patient requires high complexity decision making for assessment and support.  Case was discussed on Rounds with the Respiratory Therapy Staff  Yevonne PaxSaadat A Khan, MD [facility] Pulmonary Critical Care Medicine Sleep Medicine

## 2018-07-07 DIAGNOSIS — J80 Acute respiratory distress syndrome: Secondary | ICD-10-CM | POA: Diagnosis not present

## 2018-07-07 DIAGNOSIS — J9621 Acute and chronic respiratory failure with hypoxia: Secondary | ICD-10-CM | POA: Diagnosis not present

## 2018-07-07 DIAGNOSIS — N186 End stage renal disease: Secondary | ICD-10-CM | POA: Diagnosis not present

## 2018-07-07 DIAGNOSIS — J189 Pneumonia, unspecified organism: Secondary | ICD-10-CM | POA: Diagnosis not present

## 2018-07-07 NOTE — Progress Notes (Signed)
Pulmonary Critical [facility]   PULMONARY SERVICE  PROGRESS NOTE  Date of Service: 07/07/2018  Alec Hammedichard Storlie Jr.  ZOX:[id number]  DOB: [birth year]   DOA: 05/31/2018  Referring Physician: Carron CurieAli Hijazi, MD  HPI: Alec HammedRichard Nicolaou Jr. is a 72 y.o. male seen for follow up of Acute on Chronic Respiratory Failure.  Patient was able to do about 3 hours on pressure support back on full support right now on assist control mode  Medications: Reviewed on Rounds  Physical Exam:  Vitals: Temperature 97.3 pulse 86 respiratory rate 23 blood pressure 151/71 saturations 100%  Ventilator Settings mode of ventilation assist control FiO2 30% tidal volume 492 PEEP 5  . General: Comfortable at this time . Eyes: Grossly normal lids, irises & conjunctiva . ENT: grossly tongue is normal . Neck: no obvious mass . Cardiovascular: S1 S2 normal no gallop . Respiratory: Coarse breath sounds no rhonchi . Abdomen: soft . Skin: no rash seen on limited exam . Musculoskeletal: not rigid . Psychiatric:unable to assess . Neurologic: no seizure no involuntary movements         Lab Data:   Basic Metabolic Panel: Recent Labs  Lab 07/01/18 0604 07/03/18 0549 07/05/18 0613  NA 134* 133* 133*  K 4.2 3.6 3.5  CL 94* 93* 94*  CO2 27 26 27   GLUCOSE 105* 156* 140*  BUN 71* 45* 35*  CREATININE 4.32* 3.42* 3.10*  CALCIUM 7.7* 7.7* 7.9*  PHOS 3.8 2.9 2.2*    Liver Function Tests: Recent Labs  Lab 07/01/18 0604 07/03/18 0549 07/05/18 0613  ALBUMIN 1.8* 1.8* 1.9*   No results for input(s): LIPASE, AMYLASE in the last 168 hours. No results for input(s): AMMONIA in the last 168 hours.  CBC: Recent Labs  Lab 07/01/18 0626 07/02/18 0249 07/03/18 0549 07/05/18 0613  WBC 14.6*  --  14.8* 16.3*  HGB 7.0* 8.9* 8.7* 8.7*  HCT 22.4* 27.4* 28.0* 27.8*  MCV 95.7  --  94.6 94.9  PLT 363  --  397 429*    Cardiac Enzymes: No results for input(s): CKTOTAL, CKMB, CKMBINDEX,  TROPONINI in the last 168 hours.  BNP (last 3 results) No results for input(s): BNP in the last 8760 hours.  ProBNP (last 3 results) No results for input(s): PROBNP in the last 8760 hours.  Radiological Exams: No results found.  Assessment/Plan Active Problems:   Acute on chronic respiratory failure with hypoxia (HCC)   End stage renal disease on dialysis (HCC)   Quadriplegia (HCC)   Seizure disorder (HCC)   Pleural effusion, right   Multifocal pneumonia   ARDS (adult respiratory distress syndrome) (HCC)   1. Acute on chronic respiratory failure with hypoxia continues on full support on assist control mode right now patient is on 30% FiO2 as noted above did do about 3 hours of pressure support will reassess continue with supportive care 2. End-stage renal disease on hemodialysis continue with supportive care. 3. Quadriplegia seizure disorder no active seizures noted at baseline 4. Pleural effusion stable will follow 5. Multifocal pneumonia treated with antibiotics prognosis guarded   I have personally seen and evaluated the patient, evaluated laboratory and imaging results, formulated the assessment and plan and placed orders. The Patient requires high complexity decision making for assessment and support.  Case was discussed on Rounds with the Respiratory Therapy Staff  Yevonne PaxSaadat A Daiveon Markman, MD [facility] Pulmonary Critical Care Medicine Sleep Medicine

## 2018-07-08 DIAGNOSIS — J189 Pneumonia, unspecified organism: Secondary | ICD-10-CM | POA: Diagnosis not present

## 2018-07-08 DIAGNOSIS — J9621 Acute and chronic respiratory failure with hypoxia: Secondary | ICD-10-CM | POA: Diagnosis not present

## 2018-07-08 DIAGNOSIS — N186 End stage renal disease: Secondary | ICD-10-CM | POA: Diagnosis not present

## 2018-07-08 DIAGNOSIS — J80 Acute respiratory distress syndrome: Secondary | ICD-10-CM | POA: Diagnosis not present

## 2018-07-08 LAB — RENAL FUNCTION PANEL
ALBUMIN: 2 g/dL — AB (ref 3.5–5.0)
Anion gap: 15 (ref 5–15)
BUN: 50 mg/dL — AB (ref 8–23)
CALCIUM: 8.3 mg/dL — AB (ref 8.9–10.3)
CO2: 25 mmol/L (ref 22–32)
Chloride: 94 mmol/L — ABNORMAL LOW (ref 98–111)
Creatinine, Ser: 3.33 mg/dL — ABNORMAL HIGH (ref 0.61–1.24)
GFR calc Af Amer: 20 mL/min — ABNORMAL LOW (ref >60)
GFR calc non Af Amer: 17 mL/min — ABNORMAL LOW (ref >60)
GLUCOSE: 94 mg/dL (ref 70–99)
PHOSPHORUS: 3.2 mg/dL (ref 2.5–4.6)
Potassium: 3.8 mmol/L (ref 3.5–5.1)
SODIUM: 134 mmol/L — AB (ref 135–145)

## 2018-07-08 LAB — CBC
HCT: 28.6 % — ABNORMAL LOW (ref 39.0–52.0)
Hemoglobin: 8.9 g/dL — ABNORMAL LOW (ref 13.0–17.0)
MCH: 29.3 pg (ref 26.0–34.0)
MCHC: 31.1 g/dL (ref 30.0–36.0)
MCV: 94.1 fL (ref 78.0–100.0)
Platelets: 370 10*3/uL (ref 150–400)
RBC: 3.04 MIL/uL — ABNORMAL LOW (ref 4.22–5.81)
RDW: 15.2 % (ref 11.5–15.5)
WBC: 13.4 10*3/uL — ABNORMAL HIGH (ref 4.0–10.5)

## 2018-07-08 NOTE — Progress Notes (Signed)
Pulmonary Critical Care [facility]   PULMONARY SERVICE  PROGRESS NOTE  Date of Service: 07/08/2018  Carley Hammedichard Lavalle Jr.  JXB:[id number]  DOB: Sep 10, 1946   DOA: 06/03/2018  Referring Physician: Carron CurieAli Hijazi, MD  HPI: Carley HammedRichard Slimp Jr. is a 72 y.o. male seen for follow up of Acute on Chronic Respiratory Failure.  Currently he is weaning on pressure support the goal is for about 8 hours today and seems to be tolerating well so far  Medications: Reviewed on Rounds  Physical Exam:  Vitals: Temperature 97.9 pulse 90 respiratory 35 blood pressure 167/70 saturations 100%  Ventilator Settings mode of ventilation pressure support FiO2 20% tidal volume 400 pressure support 12 PEEP 5  . General: Comfortable at this time . Eyes: Grossly normal lids, irises & conjunctiva . ENT: grossly tongue is normal . Neck: no obvious mass . Cardiovascular: S1 S2 normal no gallop . Respiratory: No rhonchi or rales are noted . Abdomen: soft . Skin: no rash seen on limited exam . Musculoskeletal: not rigid . Psychiatric:unable to assess . Neurologic: no seizure no involuntary movements         Lab Data:   Basic Metabolic Panel: Recent Labs  Lab 07/03/18 0549 07/05/18 0613 07/08/18 0501  NA 133* 133* 134*  K 3.6 3.5 3.8  CL 93* 94* 94*  CO2 26 27 25   GLUCOSE 156* 140* 94  BUN 45* 35* 50*  CREATININE 3.42* 3.10* 3.33*  CALCIUM 7.7* 7.9* 8.3*  PHOS 2.9 2.2* 3.2    Liver Function Tests: Recent Labs  Lab 07/03/18 0549 07/05/18 0613 07/08/18 0501  ALBUMIN 1.8* 1.9* 2.0*   No results for input(s): LIPASE, AMYLASE in the last 168 hours. No results for input(s): AMMONIA in the last 168 hours.  CBC: Recent Labs  Lab 07/02/18 0249 07/03/18 0549 07/05/18 0613 07/08/18 0501  WBC  --  14.8* 16.3* 13.4*  HGB 8.9* 8.7* 8.7* 8.9*  HCT 27.4* 28.0* 27.8* 28.6*  MCV  --  94.6 94.9 94.1  PLT  --  397 429* 370    Cardiac Enzymes: No results for input(s): CKTOTAL,  CKMB, CKMBINDEX, TROPONINI in the last 168 hours.  BNP (last 3 results) No results for input(s): BNP in the last 8760 hours.  ProBNP (last 3 results) No results for input(s): PROBNP in the last 8760 hours.  Radiological Exams: No results found.  Assessment/Plan Active Problems:   Acute on chronic respiratory failure with hypoxia (HCC)   End stage renal disease on dialysis (HCC)   Quadriplegia (HCC)   Seizure disorder (HCC)   Pleural effusion, right   Multifocal pneumonia   ARDS (adult respiratory distress syndrome) (HCC)   1. Acute on chronic respiratory failure with hypoxia we will continue weaning as mentioned the goal is 8 hours we will continue pulmonary toilet supportive care secretions are fair to moderate. 2. End-stage renal disease on dialysis stable followed by nephrology. 3. Quadriplegia seizure disorder no active seizures noted. 4. Pleural effusion status post tap we will continue to monitor 5. Multifocal pneumonia treated   I have personally seen and evaluated the patient, evaluated laboratory and imaging results, formulated the assessment and plan and placed orders. The Patient requires high complexity decision making for assessment and support.  Case was discussed on Rounds with the Respiratory Therapy Staff  Yevonne PaxSaadat A Khan, MD [facility] Pulmonary Critical Care Medicine Sleep Medicine

## 2018-07-08 NOTE — Progress Notes (Signed)
[facility]  ROUNDING NOTE   Subjective:  Patient seen at bedside. Still on the ventilator. Currently in spontaneous mode while on the ventilator.   Objective:  Vital signs in last 24 hours:  Temperature 97.9 pulse 90 respirations 25 blood pressure 167/70  Physical Exam: General: Critically ill appearing  Head: [place]/AT OM moist  Eyes: anicteric  Neck: Tracheostomy  Lungs:  Ventilator assisted, scattered rhonchi, FiO2 28%  Heart: S1S2 no rubs  Abdomen:  Soft, nontender, bowel sounds present, PEG in place  Extremities: 1+ peripheral edema  Neurologic: Awake  Skin: No lesions  Access: R IJ permcath    Basic Metabolic Panel: Recent Labs  Lab 07/03/18 0549 07/05/18 0613 07/08/18 0501  NA 133* 133* 134*  K 3.6 3.5 3.8  CL 93* 94* 94*  CO2 26 27 25   GLUCOSE 156* 140* 94  BUN 45* 35* 50*  CREATININE 3.42* 3.10* 3.33*  CALCIUM 7.7* 7.9* 8.3*  PHOS 2.9 2.2* 3.2    Liver Function Tests: Recent Labs  Lab 07/03/18 0549 07/05/18 0613 07/08/18 0501  ALBUMIN 1.8* 1.9* 2.0*   No results for input(s): LIPASE, AMYLASE in the last 168 hours. No results for input(s): AMMONIA in the last 168 hours.  CBC: Recent Labs  Lab 07/02/18 0249 07/03/18 0549 07/05/18 0613 07/08/18 0501  WBC  --  14.8* 16.3* 13.4*  HGB 8.9* 8.7* 8.7* 8.9*  HCT 27.4* 28.0* 27.8* 28.6*  MCV  --  94.6 94.9 94.1  PLT  --  397 429* 370    Cardiac Enzymes: No results for input(s): CKTOTAL, CKMB, CKMBINDEX, TROPONINI in the last 168 hours.  BNP: Invalid input(s): POCBNP  CBG: No results for input(s): GLUCAP in the last 168 hours.  Microbiology: Results for orders placed or performed during the hospital encounter of 10-Oct-2018  Culture, respiratory (NON-Expectorated)     Status: None   Collection Time: 10-Oct-2018  4:00 PM  Result Value Ref Range Status   Specimen Description TRACHEAL ASPIRATE  Final   Special Requests NONE  Final   Gram Stain   Final    MODERATE WBC PRESENT,BOTH  PMN AND MONONUCLEAR RARE GRAM POSITIVE COCCI    Culture   Final    Consistent with normal respiratory flora. Performed at [facility], [address] [place], [place] [address]    Report Status 06/08/2018 FINAL  Final  Culture, Urine     Status: None   Collection Time: 10-Oct-2018  6:02 PM  Result Value Ref Range Status   Specimen Description URINE, RANDOM  Final   Special Requests NONE  Final   Culture   Final    NO GROWTH Performed at [facility], [address] [place], [place] [address]    Report Status 06/08/2018 FINAL  Final  C Difficile Quick Screen w PCR reflex     Status: None   Collection Time: 10-Oct-2018  6:10 PM  Result Value Ref Range Status   C Diff antigen NEGATIVE NEGATIVE Final   C Diff toxin NEGATIVE NEGATIVE Final   C Diff interpretation No C. difficile detected.  Final  Culture, respiratory (NON-Expectorated)     Status: None   Collection Time: 06/11/18  1:23 PM  Result Value Ref Range Status   Specimen Description TRACHEAL ASPIRATE  Final   Special Requests NONE  Final   Gram Stain   Final    ABUNDANT WBC PRESENT,BOTH PMN AND MONONUCLEAR ABUNDANT GRAM POSITIVE COCCI IN PAIRS MODERATE BUDDING YEAST SEEN  Culture   Final    Consistent with normal respiratory flora. Performed at [facility], [address], [place], [place] [address]    Report Status 06/13/2018 FINAL  Final  Culture, Urine     Status: None   Collection Time: 06/12/18  6:59 AM  Result Value Ref Range Status   Specimen Description URINE, RANDOM  Final   Special Requests NONE  Final   Culture   Final    NO GROWTH Performed at [facility], [address] [place], [place] [address]    Report Status 06/13/2018 FINAL  Final  Culture, Urine     Status: None   Collection Time: 06/16/18 10:35 AM  Result Value Ref Range Status   Specimen Description URINE, RANDOM  Final   Special Requests NONE  Final   Culture   Final    NO GROWTH Performed at [facility], [address], [place], [place] [address]    Report Status 06/17/2018 FINAL  Final  Culture, blood (routine x 2)     Status: None   Collection Time: 06/16/18 10:39 AM  Result Value Ref Range Status   Specimen Description BLOOD RIGHT ANTECUBITAL  Final   Special Requests   Final    BOTTLES DRAWN AEROBIC ONLY Blood Culture adequate volume   Culture   Final    NO GROWTH 5 DAYS Performed at [facility], [address], [place], [place] [address]    Report Status 06/18/2018 FINAL  Final  Culture, blood (routine x 2)     Status: None   Collection Time: 06/16/18 10:39 AM  Result Value Ref Range Status   Specimen Description BLOOD RIGHT ANTECUBITAL  Final   Special Requests   Final    BOTTLES DRAWN AEROBIC ONLY Blood Culture adequate volume   Culture   Final    NO GROWTH 5 DAYS Performed at [facility], [address], [place], [place] [address]    Report Status 06/25/2018 FINAL  Final  Culture, respiratory (non-expectorated)     Status: None   Collection Time: 06/20/18  7:00 PM  Result Value Ref Range Status   Specimen Description TRACHEAL ASPIRATE  Final   Special Requests NONE  Final   Gram Stain   Final    ABUNDANT WBC PRESENT, PREDOMINANTLY PMN RARE GRAM POSITIVE COCCI    Culture   Final    Consistent with normal respiratory flora. Performed at [facility], [address], [place], [place] [address]    Report Status 06/22/2018 FINAL  Final  C difficile quick scan w PCR reflex     Status: None   Collection Time: 06/23/18 10:26 PM  Result Value Ref Range Status   C Diff antigen NEGATIVE NEGATIVE Final   C Diff toxin NEGATIVE NEGATIVE Final   C Diff interpretation No C. difficile detected.  Final    Comment: Performed at [facility], [address] [place], [place] [address]  Culture, blood (routine x 2)     Status: None   Collection Time: 06/24/18  6:53 AM  Result Value Ref Range Status   Specimen Description BLOOD BLOOD RIGHT FOREARM   Final   Special Requests   Final    BOTTLES DRAWN AEROBIC ONLY Blood Culture results may not be optimal due to an inadequate volume of blood received in culture bottles   Culture   Final    NO GROWTH 5 DAYS Performed at  [facility], [address] [place], [place] [address]    Report Status 06/29/2018 FINAL  Final  Culture, blood (routine x 2)     Status: None   Collection Time: 06/24/18  6:53 AM  Result Value Ref Range Status   Specimen Description BLOOD RIGHT HAND  Final   Special Requests   Final    BOTTLES DRAWN AEROBIC ONLY Blood Culture results may not be optimal due to an inadequate volume of blood received in culture bottles   Culture   Final    NO GROWTH 5 DAYS Performed at [facility], [address] [place], [place] [address]    Report Status 06/29/2018 FINAL  Final  Culture, body fluid-bottle     Status: None   Collection Time: 06/25/18 12:06 PM  Result Value Ref Range Status   Specimen Description PLEURAL FLUID  Final   Special Requests RIGHT  Final   Culture   Final    NO GROWTH 5 DAYS Performed at [facility], [address] [place], [place] [address]    Report Status 06/30/2018 FINAL  Final  Culture, respiratory (non-expectorated)     Status: None   Collection Time: 06/27/18 12:41 PM  Result Value Ref Range Status   Specimen Description TRACHEAL ASPIRATE  Final   Special Requests NONE  Final   Gram Stain   Final    MODERATE WBC PRESENT, PREDOMINANTLY PMN NO ORGANISMS SEEN    Culture   Final    RARE Consistent with normal respiratory flora. Performed at [facility], [address] [place], [place] [address]    Report Status 06/29/2018 FINAL  Final  C difficile quick scan w PCR reflex     Status: None   Collection Time: 07/03/18 10:21 AM  Result Value Ref Range Status   C Diff antigen NEGATIVE NEGATIVE Final   C Diff toxin NEGATIVE NEGATIVE Final   C Diff interpretation No C. difficile detected.  Final    Comment:  Performed at [facility], [address] [place], [place] [address]    Coagulation Studies: No results for input(s): LABPROT, INR in the last 72 hours.  Urinalysis: No results for input(s): COLORURINE, LABSPEC, PHURINE, GLUCOSEU, HGBUR, BILIRUBINUR, KETONESUR, PROTEINUR, UROBILINOGEN, NITRITE, LEUKOCYTESUR in the last 72 hours.  Invalid input(s): APPERANCEUR    Imaging: No results found.   Medications:       Assessment/ Plan:  72 y.o. male with a PMHx of ESRD on HD MWF prior to admission here, seizure disorder, hypertension, anemia chronic kidney disease, secondary hyperparathyroidism, who was admitted to [facility] 06[facility]14/2018 for ongoing treatment of acute respiratory failure, paraplegia with minimal right arm movement, end-stage renal disease.   1.  ESRD with right internal jugular PermCath.     -Patient seen and evaluated at bedside.  He will be due for dialysis today.  Orders have been prepared.  2. Anemia chronic kidney disease.    -Hemoglobin slowly trending up and is up to 8.9.  Continue to monitor.  3.  Secondary hyperparathyroidism.    Phosphorus up to 3.2 this a.m.  4.  Acute respiratory failure.    Patient currently on spontaneous mode but is tachypneic and his tidal volumes are in the 200s.  Patient will be difficult to wean from the ventilator.  5.  Fever.  WBC count down to 13.4 and currently afebrile.   LOS: 0 Aizley Stenseth 8/12/20198:29 AM

## 2018-07-09 DIAGNOSIS — J9621 Acute and chronic respiratory failure with hypoxia: Secondary | ICD-10-CM | POA: Diagnosis not present

## 2018-07-09 DIAGNOSIS — J189 Pneumonia, unspecified organism: Secondary | ICD-10-CM | POA: Diagnosis not present

## 2018-07-09 DIAGNOSIS — J80 Acute respiratory distress syndrome: Secondary | ICD-10-CM | POA: Diagnosis not present

## 2018-07-09 DIAGNOSIS — N186 End stage renal disease: Secondary | ICD-10-CM | POA: Diagnosis not present

## 2018-07-09 LAB — HEPATITIS B SURFACE ANTIGEN: Hepatitis B Surface Ag: NEGATIVE

## 2018-07-09 NOTE — Progress Notes (Signed)
Pulmonary Critical Care Medicine [facility]   PULMONARY SERVICE  PROGRESS NOTE  Date of Service: 07/09/2018  Alec Hammedichard Kugelman Jr.  WUJ:[id number]  DOB: [birth year]   DOA: 10-22-2018  Referring Physician: Carron CurieAli Hijazi, MD  HPI: Alec HammedRichard Wegner Jr. is a 72 y.o. male seen for follow up of Acute on Chronic Respiratory Failure.  Patient remains on full support currently is on assist control mode impairment did 4 hours of pressure support 12/5 but had some desaturations noted.  Respiratory therapy To reassess and try to resume the wean today  Medications: Reviewed on Rounds  Physical Exam:  Vitals: Temperature 98.5 pulse 95 respiratory 24 blood pressure 150/81 saturations 100%  Ventilator Settings mode of ventilation assist control FiO2 28% tidal volume 490 PEEP 5  . General: Comfortable at this time . Eyes: Grossly normal lids, irises & conjunctiva . ENT: grossly tongue is normal . Neck: no obvious mass . Cardiovascular: S1 S2 normal no gallop . Respiratory: No rhonchi or rales are noted . Abdomen: soft . Skin: no rash seen on limited exam . Musculoskeletal: not rigid . Psychiatric:unable to assess . Neurologic: no seizure no involuntary movements         Lab Data:   Basic Metabolic Panel: Recent Labs  Lab 07/03/18 0549 07/05/18 0613 07/08/18 0501  NA 133* 133* 134*  K 3.6 3.5 3.8  CL 93* 94* 94*  CO2 26 27 25   GLUCOSE 156* 140* 94  BUN 45* 35* 50*  CREATININE 3.42* 3.10* 3.33*  CALCIUM 7.7* 7.9* 8.3*  PHOS 2.9 2.2* 3.2    Liver Function Tests: Recent Labs  Lab 07/03/18 0549 07/05/18 0613 07/08/18 0501  ALBUMIN 1.8* 1.9* 2.0*   No results for input(s): LIPASE, AMYLASE in the last 168 hours. No results for input(s): AMMONIA in the last 168 hours.  CBC: Recent Labs  Lab 07/03/18 0549 07/05/18 0613 07/08/18 0501  WBC 14.8* 16.3* 13.4*  HGB 8.7* 8.7* 8.9*  HCT 28.0* 27.8* 28.6*  MCV 94.6 94.9 94.1  PLT 397 429* 370    Cardiac  Enzymes: No results for input(s): CKTOTAL, CKMB, CKMBINDEX, TROPONINI in the last 168 hours.  BNP (last 3 results) No results for input(s): BNP in the last 8760 hours.  ProBNP (last 3 results) No results for input(s): PROBNP in the last 8760 hours.  Radiological Exams: No results found.  Assessment/Plan Active Problems:   Acute on chronic respiratory failure with hypoxia (HCC)   End stage renal disease on dialysis (HCC)   Quadriplegia (HCC)   Seizure disorder (HCC)   Pleural effusion, right   Multifocal pneumonia   ARDS (adult respiratory distress syndrome) (HCC)   1. Acute on chronic respiratory failure with hypoxia patient is to be reassessed and tried on pressure support wean again.  We will continue with supportive care 2. Seizure disorder no active seizures 3. End-stage renal disease on hemodialysis continue supportive care 4. Quadriplegia at baseline we will continue to follow 5. Pleural effusion right side continue with supportive care 6. Multifocal pneumonia treated we will follow 7. ARDS clinically improved however still oxygen requirements are at 45%   I have personally seen and evaluated the patient, evaluated laboratory and imaging results, formulated the assessment and plan and placed orders. The Patient requires high complexity decision making for assessment and support.  Case was discussed on Rounds with the Respiratory Therapy Staff  Yevonne PaxSaadat A Khan, MD [facility] Pulmonary Critical Care Medicine Sleep Medicine

## 2018-07-10 DIAGNOSIS — J189 Pneumonia, unspecified organism: Secondary | ICD-10-CM | POA: Diagnosis not present

## 2018-07-10 DIAGNOSIS — J9621 Acute and chronic respiratory failure with hypoxia: Secondary | ICD-10-CM | POA: Diagnosis not present

## 2018-07-10 DIAGNOSIS — N186 End stage renal disease: Secondary | ICD-10-CM | POA: Diagnosis not present

## 2018-07-10 DIAGNOSIS — J80 Acute respiratory distress syndrome: Secondary | ICD-10-CM | POA: Diagnosis not present

## 2018-07-10 LAB — RENAL FUNCTION PANEL
ALBUMIN: 2 g/dL — AB (ref 3.5–5.0)
Anion gap: 10 (ref 5–15)
BUN: 42 mg/dL — AB (ref 8–23)
CO2: 29 mmol/L (ref 22–32)
Calcium: 8.2 mg/dL — ABNORMAL LOW (ref 8.9–10.3)
Chloride: 96 mmol/L — ABNORMAL LOW (ref 98–111)
Creatinine, Ser: 2.93 mg/dL — ABNORMAL HIGH (ref 0.61–1.24)
GFR calc Af Amer: 23 mL/min — ABNORMAL LOW (ref >60)
GFR calc non Af Amer: 20 mL/min — ABNORMAL LOW (ref >60)
GLUCOSE: 99 mg/dL (ref 70–99)
PHOSPHORUS: 2.8 mg/dL (ref 2.5–4.6)
POTASSIUM: 3.8 mmol/L (ref 3.5–5.1)
Sodium: 135 mmol/L (ref 135–145)

## 2018-07-10 LAB — CBC
HEMATOCRIT: 26.2 % — AB (ref 39.0–52.0)
Hemoglobin: 8.2 g/dL — ABNORMAL LOW (ref 13.0–17.0)
MCH: 29.6 pg (ref 26.0–34.0)
MCHC: 31.3 g/dL (ref 30.0–36.0)
MCV: 94.6 fL (ref 78.0–100.0)
Platelets: 332 10*3/uL (ref 150–400)
RBC: 2.77 MIL/uL — ABNORMAL LOW (ref 4.22–5.81)
RDW: 14.7 % (ref 11.5–15.5)
WBC: 12.4 10*3/uL — ABNORMAL HIGH (ref 4.0–10.5)

## 2018-07-10 NOTE — Progress Notes (Signed)
[facility]  ROUNDING NOTE   Subjective:  Patient remains critically ill. Still on the ventilator. Due for dialysis later today.   Objective:  Vital signs in last 24 hours:  Temperature 96.9 pulse 83 respirations 18 blood pressure 175/66  Physical Exam: General: Critically ill appearing  Head: [place]/AT OM moist  Eyes: anicteric  Neck: Tracheostomy  Lungs:  Ventilator assisted, scattered rhonchi, FiO2 28%  Heart: S1S2 no rubs  Abdomen:  Soft, nontender, bowel sounds present, PEG in place  Extremities: 1+ peripheral edema  Neurologic: Awake  Skin: No lesions  Access: R IJ permcath    Basic Metabolic Panel: Recent Labs  Lab 07/05/18 0613 07/08/18 0501 07/10/18 0448  NA 133* 134* 135  K 3.5 3.8 3.8  CL 94* 94* 96*  CO2 27 25 29   GLUCOSE 140* 94 99  BUN 35* 50* 42*  CREATININE 3.10* 3.33* 2.93*  CALCIUM 7.9* 8.3* 8.2*  PHOS 2.2* 3.2 2.8    Liver Function Tests: Recent Labs  Lab 07/05/18 0613 07/08/18 0501 07/10/18 0448  ALBUMIN 1.9* 2.0* 2.0*   No results for input(s): LIPASE, AMYLASE in the last 168 hours. No results for input(s): AMMONIA in the last 168 hours.  CBC: Recent Labs  Lab 07/05/18 0613 07/08/18 0501 07/10/18 0448  WBC 16.3* 13.4* 12.4*  HGB 8.7* 8.9* 8.2*  HCT 27.8* 28.6* 26.2*  MCV 94.9 94.1 94.6  PLT 429* 370 332    Cardiac Enzymes: No results for input(s): CKTOTAL, CKMB, CKMBINDEX, TROPONINI in the last 168 hours.  BNP: Invalid input(s): POCBNP  CBG: No results for input(s): GLUCAP in the last 168 hours.  Microbiology: Results for orders placed or performed during the hospital encounter of 06/24/2018  Culture, respiratory (NON-Expectorated)     Status: None   Collection Time: 06/04/2018  4:00 PM  Result Value Ref Range Status   Specimen Description TRACHEAL ASPIRATE  Final   Special Requests NONE  Final   Gram Stain   Final    MODERATE WBC PRESENT,BOTH PMN AND MONONUCLEAR RARE GRAM POSITIVE COCCI    Culture    Final    Consistent with normal respiratory flora. Performed at [facility], [address] [place], [place] [address]    Report Status 06/08/2018 FINAL  Final  Culture, Urine     Status: None   Collection Time: 06/05/2018  6:02 PM  Result Value Ref Range Status   Specimen Description URINE, RANDOM  Final   Special Requests NONE  Final   Culture   Final    NO GROWTH Performed at [facility], [address] [place], [place] [address]    Report Status 06/08/2018 FINAL  Final  C Difficile Quick Screen w PCR reflex     Status: None   Collection Time: 06/13/2018  6:10 PM  Result Value Ref Range Status   C Diff antigen NEGATIVE NEGATIVE Final   C Diff toxin NEGATIVE NEGATIVE Final   C Diff interpretation No C. difficile detected.  Final  Culture, respiratory (NON-Expectorated)     Status: None   Collection Time: 06/11/18  1:23 PM  Result Value Ref Range Status   Specimen Description TRACHEAL ASPIRATE  Final   Special Requests NONE  Final   Gram Stain   Final    ABUNDANT WBC PRESENT,BOTH PMN AND MONONUCLEAR ABUNDANT GRAM POSITIVE COCCI IN PAIRS MODERATE BUDDING YEAST SEEN    Culture   Final    Consistent with normal respiratory flora. Performed at [facility], [address] [place], [place] [address]    Report Status 06/13/2018 FINAL  Final  Culture, Urine     Status: None   Collection Time: 06/12/18  6:59 AM  Result Value Ref Range Status   Specimen Description URINE, RANDOM  Final   Special Requests NONE  Final   Culture   Final    NO GROWTH Performed at [facility], [address] [place], [place] [address]    Report Status 06/13/2018 FINAL  Final  Culture, Urine     Status: None   Collection Time: 06/16/18 10:35 AM  Result Value Ref Range Status   Specimen Description URINE, RANDOM  Final   Special Requests NONE  Final   Culture   Final    NO GROWTH Performed at [facility], [address] [place], [place] [address]    Report  Status 06/17/2018 FINAL  Final  Culture, blood (routine x 2)     Status: None   Collection Time: 06/16/18 10:39 AM  Result Value Ref Range Status   Specimen Description BLOOD RIGHT ANTECUBITAL  Final   Special Requests   Final    BOTTLES DRAWN AEROBIC ONLY Blood Culture adequate volume   Culture   Final    NO GROWTH 5 DAYS Performed at [facility], [address] [place], [place] [address]    Report Status 2018-08-25 FINAL  Final  Culture, blood (routine x 2)     Status: None   Collection Time: 06/16/18 10:39 AM  Result Value Ref Range Status   Specimen Description BLOOD RIGHT ANTECUBITAL  Final   Special Requests   Final    BOTTLES DRAWN AEROBIC ONLY Blood Culture adequate volume   Culture   Final    NO GROWTH 5 DAYS Performed at [facility], [address] [place], [place] [address]    Report Status 2018-08-25 FINAL  Final  Culture, respiratory (non-expectorated)     Status: None   Collection Time: 06/20/18  7:00 PM  Result Value Ref Range Status   Specimen Description TRACHEAL ASPIRATE  Final   Special Requests NONE  Final   Gram Stain   Final    ABUNDANT WBC PRESENT, PREDOMINANTLY PMN RARE GRAM POSITIVE COCCI    Culture   Final    Consistent with normal respiratory flora. Performed at [facility], [address] [place], [place] [address]    Report Status 06/22/2018 FINAL  Final  C difficile quick scan w PCR reflex     Status: None   Collection Time: 06/23/18 10:26 PM  Result Value Ref Range Status   C Diff antigen NEGATIVE NEGATIVE Final   C Diff toxin NEGATIVE NEGATIVE Final   C Diff interpretation No C. difficile detected.  Final    Comment: Performed at [facility], [address] [place], [place] [address]  Culture, blood (routine x 2)     Status: None   Collection Time: 06/24/18  6:53 AM  Result Value Ref Range Status   Specimen Description BLOOD BLOOD RIGHT FOREARM  Final   Special Requests   Final    BOTTLES DRAWN AEROBIC  ONLY Blood Culture results may not be optimal due to an inadequate volume of blood received in culture bottles   Culture   Final    NO GROWTH 5 DAYS Performed at [facility], [address] [place], [place] [address]    Report  Status 06/29/2018 FINAL  Final  Culture, blood (routine x 2)     Status: None   Collection Time: 06/24/18  6:53 AM  Result Value Ref Range Status   Specimen Description BLOOD RIGHT HAND  Final   Special Requests   Final    BOTTLES DRAWN AEROBIC ONLY Blood Culture results may not be optimal due to an inadequate volume of blood received in culture bottles   Culture   Final    NO GROWTH 5 DAYS Performed at [facility], [address] [place], [place] [address]    Report Status 06/29/2018 FINAL  Final  Culture, body fluid-bottle     Status: None   Collection Time: 06/25/18 12:06 PM  Result Value Ref Range Status   Specimen Description PLEURAL FLUID  Final   Special Requests RIGHT  Final   Culture   Final    NO GROWTH 5 DAYS Performed at [facility], [address], [place], [place] [address]    Report Status 06/30/2018 FINAL  Final  Culture, respiratory (non-expectorated)     Status: None   Collection Time: 06/27/18 12:41 PM  Result Value Ref Range Status   Specimen Description TRACHEAL ASPIRATE  Final   Special Requests NONE  Final   Gram Stain   Final    MODERATE WBC PRESENT, PREDOMINANTLY PMN NO ORGANISMS SEEN    Culture   Final    RARE Consistent with normal respiratory flora. Performed at [facility], [address], [place], [place] [address]    Report Status 06/29/2018 FINAL  Final  C difficile quick scan w PCR reflex     Status: None   Collection Time: 07/03/18 10:21 AM  Result Value Ref Range Status   C Diff antigen NEGATIVE NEGATIVE Final   C Diff toxin NEGATIVE NEGATIVE Final   C Diff interpretation No C. difficile detected.  Final    Comment: Performed at [facility], [address], [place],  [place] [address]    Coagulation Studies: No results for input(s): LABPROT, INR in the last 72 hours.  Urinalysis: No results for input(s): COLORURINE, LABSPEC, PHURINE, GLUCOSEU, HGBUR, BILIRUBINUR, KETONESUR, PROTEINUR, UROBILINOGEN, NITRITE, LEUKOCYTESUR in the last 72 hours.  Invalid input(s): APPERANCEUR    Imaging: No results found.   Medications:       Assessment/ Plan:  72 y.o. male with a PMHx of ESRD on HD MWF prior to admission here, seizure disorder, hypertension, anemia chronic kidney disease, secondary hyperparathyroidism, who was admitted to [facility] on 06/03/2018 for ongoing treatment of acute respiratory failure, paraplegia with minimal right arm movement, end-stage renal disease.   1.  ESRD with right internal jugular PermCath.     -Continue patient on MWF dialysis schedule.  He is due for dialysis today.  2. Anemia chronic kidney disease.    -Hemoglobin down a bit to 8.2.  Repeat CBC on Friday.  3.  Secondary hyperparathyroidism.    Phosphorus currently 2.8 and acceptable.  4.  Acute respiratory failure.    Patient remains on ventilatory support.  FiO2 stable at 28%.  5.  Fever.  WBC count continues to trend down slowly and is currently 12.4.   LOS: 0 Alec Bush 8/14/201910:21 AM

## 2018-07-10 NOTE — Progress Notes (Signed)
[facility]   PULMONARY SERVICE  PROGRESS NOTE  Date of Service: 07/10/2018  Carley Hammedichard Brosseau Jr.  ZOX:[id number]  DOB: 04-14-1946   DOA: 05/31/2018  Referring Physician: Carron CurieAli Hijazi, MD  HPI: Carley HammedRichard Kinter Jr. is a 72 y.o. male seen for follow up of Acute on Chronic Respiratory Failure.  Patient remains on full vent support he was attempted on spontaneous breathing trial which he failed.  Right now is back on the ventilator and assist control mode  Medications: Reviewed on Rounds  Physical Exam:  Vitals: Temperature 96.9 pulse 83 respiratory rate 18 blood pressure 125/66 saturations 100%  Ventilator Settings mode of ventilation assist control FiO2 28% tidal volume 636 PEEP 7  . General: Comfortable at this time . Eyes: Grossly normal lids, irises & conjunctiva . ENT: grossly tongue is normal . Neck: no obvious mass . Cardiovascular: S1 S2 normal no gallop . Respiratory: Coarse breath sounds no rhonchi are noted . Abdomen: soft . Skin: no rash seen on limited exam . Musculoskeletal: not rigid . Psychiatric:unable to assess . Neurologic: no seizure no involuntary movements         Lab Data:   Basic Metabolic Panel: Recent Labs  Lab 07/05/18 0613 07/08/18 0501 07/10/18 0448  NA 133* 134* 135  K 3.5 3.8 3.8  CL 94* 94* 96*  CO2 27 25 29   GLUCOSE 140* 94 99  BUN 35* 50* 42*  CREATININE 3.10* 3.33* 2.93*  CALCIUM 7.9* 8.3* 8.2*  PHOS 2.2* 3.2 2.8    Liver Function Tests: Recent Labs  Lab 07/05/18 0613 07/08/18 0501 07/10/18 0448  ALBUMIN 1.9* 2.0* 2.0*   No results for input(s): LIPASE, AMYLASE in the last 168 hours. No results for input(s): AMMONIA in the last 168 hours.  CBC: Recent Labs  Lab 07/05/18 0613 07/08/18 0501 07/10/18 0448  WBC 16.3* 13.4* 12.4*  HGB 8.7* 8.9* 8.2*  HCT 27.8* 28.6* 26.2*  MCV 94.9 94.1 94.6  PLT 429* 370 332    Cardiac Enzymes: No results for input(s): CKTOTAL,  CKMB, CKMBINDEX, TROPONINI in the last 168 hours.  BNP (last 3 results) No results for input(s): BNP in the last 8760 hours.  ProBNP (last 3 results) No results for input(s): PROBNP in the last 8760 hours.  Radiological Exams: No results found.  Assessment/Plan Active Problems:   Acute on chronic respiratory failure with hypoxia (HCC)   End stage renal disease on dialysis (HCC)   Quadriplegia (HCC)   Seizure disorder (HCC)   Pleural effusion, right   Multifocal pneumonia   ARDS (adult respiratory distress syndrome) (HCC)   1. Acute on chronic respiratory failure with hypoxia continue with full vent support check the spontaneous breathing trials daily as noted above so far he has been failing the attempts will reassess again tomorrow. 2. Quadriplegia at baseline continue supportive care 3. End-stage renal failure on dialysis continue present therapy 4. Seizure disorder no active seizures noted. 5. Pleural effusion at baseline 6. Multifocal pneumonia treated with antibiotics we will continue to follow    I have personally seen and evaluated the patient, evaluated laboratory and imaging results, formulated the assessment and plan and placed orders. The Patient requires high complexity decision making for assessment and support.  Case was discussed on Rounds with the Respiratory Therapy Staff  Yevonne PaxSaadat A Khan, MD [facility] Pulmonary Critical Care Medicine Sleep Medicine

## 2018-07-11 DIAGNOSIS — J189 Pneumonia, unspecified organism: Secondary | ICD-10-CM | POA: Diagnosis not present

## 2018-07-11 DIAGNOSIS — J9621 Acute and chronic respiratory failure with hypoxia: Secondary | ICD-10-CM | POA: Diagnosis not present

## 2018-07-11 DIAGNOSIS — J80 Acute respiratory distress syndrome: Secondary | ICD-10-CM | POA: Diagnosis not present

## 2018-07-11 DIAGNOSIS — N186 End stage renal disease: Secondary | ICD-10-CM | POA: Diagnosis not present

## 2018-07-11 NOTE — Progress Notes (Signed)
Pulmonary Critical Care [facility]   PULMONARY SERVICE  PROGRESS NOTE  Date of Service: 07/11/2018  Alec Hammedichard Amadi Jr.  ZOX:[id number]  DOB: Feb 07, 1946   DOA: 06/12/2018  Referring Physician: Carron CurieAli Hijazi, MD  HPI: Alec HammedRichard Langham Jr. is a 72 y.o. male seen for follow up of Acute on Chronic Respiratory Failure.  Currently patient is on full support was attempted on spontaneous breathing trial which he failed again.  Patient is right now on assist control mode has been on 28% oxygen.  Medications: Reviewed on Rounds  Physical Exam:  Vitals: Temperature 97.6 pulse 91 respiratory rate 21 blood pressure 165/81 saturations 100%  Ventilator Settings mode of ventilation assist control FiO2 20% tidal volume 464 PEEP 7  . General: Comfortable at this time . Eyes: Grossly normal lids, irises & conjunctiva . ENT: grossly tongue is normal . Neck: no obvious mass . Cardiovascular: S1 S2 normal no gallop . Respiratory: No rhonchi or rales are noted . Abdomen: soft . Skin: no rash seen on limited exam . Musculoskeletal: not rigid . Psychiatric:unable to assess . Neurologic: no seizure no involuntary movements         Lab Data:   Basic Metabolic Panel: Recent Labs  Lab 07/05/18 0613 07/08/18 0501 07/10/18 0448  NA 133* 134* 135  K 3.5 3.8 3.8  CL 94* 94* 96*  CO2 27 25 29   GLUCOSE 140* 94 99  BUN 35* 50* 42*  CREATININE 3.10* 3.33* 2.93*  CALCIUM 7.9* 8.3* 8.2*  PHOS 2.2* 3.2 2.8    Liver Function Tests: Recent Labs  Lab 07/05/18 0613 07/08/18 0501 07/10/18 0448  ALBUMIN 1.9* 2.0* 2.0*   No results for input(s): LIPASE, AMYLASE in the last 168 hours. No results for input(s): AMMONIA in the last 168 hours.  CBC: Recent Labs  Lab 07/05/18 0613 07/08/18 0501 07/10/18 0448  WBC 16.3* 13.4* 12.4*  HGB 8.7* 8.9* 8.2*  HCT 27.8* 28.6* 26.2*  MCV 94.9 94.1 94.6  PLT 429* 370 332    Cardiac Enzymes: No results for input(s): CKTOTAL, CKMB,  CKMBINDEX, TROPONINI in the last 168 hours.  BNP (last 3 results) No results for input(s): BNP in the last 8760 hours.  ProBNP (last 3 results) No results for input(s): PROBNP in the last 8760 hours.  Radiological Exams: No results found.  Assessment/Plan Active Problems:   Acute on chronic respiratory failure with hypoxia (HCC)   End stage renal disease on dialysis (HCC)   Quadriplegia (HCC)   Seizure disorder (HCC)   Pleural effusion, right   Multifocal pneumonia   ARDS (adult respiratory distress syndrome) (HCC)   1. Acute on chronic respiratory failure with hypoxia patient continues on assist control mode has failed spontaneous breathing trials multiple times now. 2. End-stage renal disease on dialysis followed by nephrology will continue with supportive care 3. Quadriplegia seizure disorder patient is at baseline 4. Multifocal pneumonia treated since the patient is failing spontaneous breathing trial would suggest getting a follow-up chest x-ray. 5. ARDS clinically is better with improving oxygen requirements however still is not able to wean   I have personally seen and evaluated the patient, evaluated laboratory and imaging results, formulated the assessment and plan and placed orders. The Patient requires high complexity decision making for assessment and support.  Case was discussed on Rounds with the Respiratory Therapy Staff  Yevonne PaxSaadat A Krystian Ferrentino, MD [facility] Pulmonary Critical Care Medicine Sleep Medicine

## 2018-07-12 DIAGNOSIS — J9621 Acute and chronic respiratory failure with hypoxia: Secondary | ICD-10-CM | POA: Diagnosis not present

## 2018-07-12 DIAGNOSIS — J80 Acute respiratory distress syndrome: Secondary | ICD-10-CM | POA: Diagnosis not present

## 2018-07-12 DIAGNOSIS — J189 Pneumonia, unspecified organism: Secondary | ICD-10-CM | POA: Diagnosis not present

## 2018-07-12 DIAGNOSIS — N186 End stage renal disease: Secondary | ICD-10-CM | POA: Diagnosis not present

## 2018-07-12 LAB — CBC
HCT: 30.6 % — ABNORMAL LOW (ref 39.0–52.0)
HEMOGLOBIN: 9.4 g/dL — AB (ref 13.0–17.0)
MCH: 29.1 pg (ref 26.0–34.0)
MCHC: 30.7 g/dL (ref 30.0–36.0)
MCV: 94.7 fL (ref 78.0–100.0)
Platelets: 300 10*3/uL (ref 150–400)
RBC: 3.23 MIL/uL — AB (ref 4.22–5.81)
RDW: 14.7 % (ref 11.5–15.5)
WBC: 15.2 10*3/uL — ABNORMAL HIGH (ref 4.0–10.5)

## 2018-07-12 LAB — RENAL FUNCTION PANEL
ALBUMIN: 2.2 g/dL — AB (ref 3.5–5.0)
ANION GAP: 12 (ref 5–15)
BUN: 39 mg/dL — ABNORMAL HIGH (ref 8–23)
CALCIUM: 8.5 mg/dL — AB (ref 8.9–10.3)
CO2: 24 mmol/L (ref 22–32)
Chloride: 98 mmol/L (ref 98–111)
Creatinine, Ser: 2.69 mg/dL — ABNORMAL HIGH (ref 0.61–1.24)
GFR calc non Af Amer: 22 mL/min — ABNORMAL LOW (ref >60)
GFR, EST AFRICAN AMERICAN: 25 mL/min — AB (ref >60)
GLUCOSE: 105 mg/dL — AB (ref 70–99)
Phosphorus: 2.9 mg/dL (ref 2.5–4.6)
Potassium: 3.8 mmol/L (ref 3.5–5.1)
SODIUM: 134 mmol/L — AB (ref 135–145)

## 2018-07-12 NOTE — Progress Notes (Signed)
[facility]  ROUNDING NOTE   Subjective:  Patient seen and evaluated during hemodialysis. Tolerating well. Ultrafiltration target 1.5 kg.   Objective:  Vital signs in last 24 hours:  Temperature 97.3 pulse 83 respirations 18 blood pressure 184/97  Physical Exam: General: Critically ill appearing  Head: Orestes/AT OM moist  Eyes: anicteric  Neck: Tracheostomy  Lungs:  Ventilator assisted, scattered rhonchi  Heart: S1S2 no rubs  Abdomen:  Soft, nontender, bowel sounds present, PEG in place  Extremities: 1+ peripheral edema  Neurologic: Awake, but not following commands  Skin: No lesions  Access: R IJ permcath    Basic Metabolic Panel: Recent Labs  Lab 07/08/18 0501 07/10/18 0448 07/12/18 0616  NA 134* 135 134*  K 3.8 3.8 3.8  CL 94* 96* 98  CO2 25 29 24   GLUCOSE 94 99 105*  BUN 50* 42* 39*  CREATININE 3.33* 2.93* 2.69*  CALCIUM 8.3* 8.2* 8.5*  PHOS 3.2 2.8 2.9    Liver Function Tests: Recent Labs  Lab 07/08/18 0501 07/10/18 0448 07/12/18 0616  ALBUMIN 2.0* 2.0* 2.2*   No results for input(s): LIPASE, AMYLASE in the last 168 hours. No results for input(s): AMMONIA in the last 168 hours.  CBC: Recent Labs  Lab 07/08/18 0501 07/10/18 0448 07/12/18 0616  WBC 13.4* 12.4* 15.2*  HGB 8.9* 8.2* 9.4*  HCT 28.6* 26.2* 30.6*  MCV 94.1 94.6 94.7  PLT 370 332 300    Cardiac Enzymes: No results for input(s): CKTOTAL, CKMB, CKMBINDEX, TROPONINI in the last 168 hours.  BNP: Invalid input(s): POCBNP  CBG: No results for input(s): GLUCAP in the last 168 hours.  Microbiology: Results for orders placed or performed during the hospital encounter of 06-09-18  Culture, respiratory (NON-Expectorated)     Status: None   Collection Time: 2018-06-09  4:00 PM  Result Value Ref Range Status   Specimen Description TRACHEAL ASPIRATE  Final   Special Requests NONE  Final   Gram Stain   Final    MODERATE WBC PRESENT,BOTH PMN AND MONONUCLEAR RARE GRAM POSITIVE  COCCI    Culture   Final    Consistent with normal respiratory flora. Performed at [facility], [address], [place], [place] [address]    Report Status 06/08/2018 FINAL  Final  Culture, Urine     Status: None   Collection Time: 06/09/2018  6:02 PM  Result Value Ref Range Status   Specimen Description URINE, RANDOM  Final   Special Requests NONE  Final   Culture   Final    NO GROWTH Performed at [facility], [address], [place], [place] [address]    Report Status 06/08/2018 FINAL  Final  C Difficile Quick Screen w PCR reflex     Status: None   Collection Time: 09-Jun-2018  6:10 PM  Result Value Ref Range Status   C Diff antigen NEGATIVE NEGATIVE Final   C Diff toxin NEGATIVE NEGATIVE Final   C Diff interpretation No C. difficile detected.  Final  Culture, respiratory (NON-Expectorated)     Status: None   Collection Time: 06/11/18  1:23 PM  Result Value Ref Range Status   Specimen Description TRACHEAL ASPIRATE  Final   Special Requests NONE  Final   Gram Stain   Final    ABUNDANT WBC PRESENT,BOTH PMN AND MONONUCLEAR ABUNDANT GRAM POSITIVE COCCI IN PAIRS MODERATE BUDDING YEAST SEEN    Culture   Final    Consistent with normal respiratory flora. Performed at  [facility], [address] [place], [place] [address]    Report Status 06/13/2018 FINAL  Final  Culture, Urine     Status: None   Collection Time: 06/12/18  6:59 AM  Result Value Ref Range Status   Specimen Description URINE, RANDOM  Final   Special Requests NONE  Final   Culture   Final    NO GROWTH Performed at [facility], [address] [place], [place] [address]    Report Status 06/13/2018 FINAL  Final  Culture, Urine     Status: None   Collection Time: 06/16/18 10:35 AM  Result Value Ref Range Status   Specimen Description URINE, RANDOM  Final   Special Requests NONE  Final   Culture   Final    NO GROWTH Performed at [facility], [address] [place], [place]  [address]    Report Status 06/17/2018 FINAL  Final  Culture, blood (routine x 2)     Status: None   Collection Time: 06/16/18 10:39 AM  Result Value Ref Range Status   Specimen Description BLOOD RIGHT ANTECUBITAL  Final   Special Requests   Final    BOTTLES DRAWN AEROBIC ONLY Blood Culture adequate volume   Culture   Final    NO GROWTH 5 DAYS Performed at [facility], [address] [place], [place] [address]    Report Status 06/24/2018 FINAL  Final  Culture, blood (routine x 2)     Status: None   Collection Time: 06/16/18 10:39 AM  Result Value Ref Range Status   Specimen Description BLOOD RIGHT ANTECUBITAL  Final   Special Requests   Final    BOTTLES DRAWN AEROBIC ONLY Blood Culture adequate volume   Culture   Final    NO GROWTH 5 DAYS Performed at [facility], [address] [place], [place] [address]    Report Status 06/19/2018 FINAL  Final  Culture, respiratory (non-expectorated)     Status: None   Collection Time: 06/20/18  7:00 PM  Result Value Ref Range Status   Specimen Description TRACHEAL ASPIRATE  Final   Special Requests NONE  Final   Gram Stain   Final    ABUNDANT WBC PRESENT, PREDOMINANTLY PMN RARE GRAM POSITIVE COCCI    Culture   Final    Consistent with normal respiratory flora. Performed at [facility], [address] [place], [place] [address]    Report Status 06/22/2018 FINAL  Final  C difficile quick scan w PCR reflex     Status: None   Collection Time: 06/23/18 10:26 PM  Result Value Ref Range Status   C Diff antigen NEGATIVE NEGATIVE Final   C Diff toxin NEGATIVE NEGATIVE Final   C Diff interpretation No C. difficile detected.  Final    Comment: Performed at [facility], [address] [place], [place] [address]  Culture, blood (routine x 2)     Status: None   Collection Time: 06/24/18  6:53 AM  Result Value Ref Range Status   Specimen Description BLOOD BLOOD RIGHT FOREARM  Final   Special Requests   Final     BOTTLES DRAWN AEROBIC ONLY Blood Culture results may not be optimal due to an inadequate volume of blood received in culture bottles   Culture   Final    NO GROWTH 5 DAYS Performed at [facility], [address] [place], [place] [address]  Report Status 06/29/2018 FINAL  Final  Culture, blood (routine x 2)     Status: None   Collection Time: 06/24/18  6:53 AM  Result Value Ref Range Status   Specimen Description BLOOD RIGHT HAND  Final   Special Requests   Final    BOTTLES DRAWN AEROBIC ONLY Blood Culture results may not be optimal due to an inadequate volume of blood received in culture bottles   Culture   Final    NO GROWTH 5 DAYS Performed at [facility], [address] [place], [place] [address]    Report Status 06/29/2018 FINAL  Final  Culture, body fluid-bottle     Status: None   Collection Time: 06/25/18 12:06 PM  Result Value Ref Range Status   Specimen Description PLEURAL FLUID  Final   Special Requests RIGHT  Final   Culture   Final    NO GROWTH 5 DAYS Performed at [facility], [address] [place], [place] [address]    Report Status 06/30/2018 FINAL  Final  Culture, respiratory (non-expectorated)     Status: None   Collection Time: 06/27/18 12:41 PM  Result Value Ref Range Status   Specimen Description TRACHEAL ASPIRATE  Final   Special Requests NONE  Final   Gram Stain   Final    MODERATE WBC PRESENT, PREDOMINANTLY PMN NO ORGANISMS SEEN    Culture   Final    RARE Consistent with normal respiratory flora. Performed at [facility], [address] [place], [place] [address]    Report Status 06/29/2018 FINAL  Final  C difficile quick scan w PCR reflex     Status: None   Collection Time: 07/03/18 10:21 AM  Result Value Ref Range Status   C Diff antigen NEGATIVE NEGATIVE Final   C Diff toxin NEGATIVE NEGATIVE Final   C Diff interpretation No C. difficile detected.  Final    Comment: Performed at [facility], [address] [place], [place] [address]    Coagulation Studies: No results for input(s): LABPROT, INR in the last 72 hours.  Urinalysis: No results for input(s): COLORURINE, LABSPEC, PHURINE, GLUCOSEU, HGBUR, BILIRUBINUR, KETONESUR, PROTEINUR, UROBILINOGEN, NITRITE, LEUKOCYTESUR in the last 72 hours.  Invalid input(s): APPERANCEUR    Imaging: No results found.   Medications:       Assessment/ Plan:  72 y.o. male with a PMHx of ESRD on HD MWF prior to admission here, seizure disorder, hypertension, anemia chronic kidney disease, secondary hyperparathyroidism, who was admitted to [facility] on 06/18/2018 for ongoing treatment of acute respiratory failure, paraplegia with minimal right arm movement, end-stage renal disease.   1.  ESRD with right internal jugular PermCath.     -Patient seen during hemodialysis today.  Tolerating well.  Ultrafiltration target 1.5 kg.  2. Anemia chronic kidney disease.    -Hemoglobin up to 9.4 and rising.  Continue to monitor CBC.  3.  Secondary hyperparathyroidism.    Serum phosphorus 2.9 today and under good control..  4.  Acute respiratory failure.    Patient being maintained on ventilatory support.  Follow recommendations from pulmonary/critical care.  5.  Fever.  Patient continues to have fluctuating WBC count.  Infection management as per primary team.   LOS: 0 Fabrizio Filip 8/16/20196:31 PM

## 2018-07-12 NOTE — Progress Notes (Signed)
Pulmonary Critical Care [facility]   PULMONARY SERVICE  PROGRESS NOTE  Date of Service: 07/12/2018  Carley Hammedichard Rada Jr.  [id number]  DOB: [birth year]   DOA: July 20, 2018  Referring Physician: Carron CurieAli Hijazi, MD  HPI: Carley HammedRichard Brooking Jr. is a 72 y.o. male seen for follow up of Acute on Chronic Respiratory Failure.  At this time patient is on full support and assist control mode she is not been tolerating spontaneous breathing trial so was placed back on the vent  Medications: Reviewed on Rounds  Physical Exam:  Vitals: Temperature 97.3 pulse 83 respiratory rate 18 blood pressure 184/97 saturations 98%  Ventilator Settings mode of ventilation assist control FiO2 28% PEEP 5 tidal volume 500  . General: Comfortable at this time . Eyes: Grossly normal lids, irises & conjunctiva . ENT: grossly tongue is normal . Neck: no obvious mass . Cardiovascular: S1 S2 normal no gallop . Respiratory: Coarse breath sounds noted bilaterally . Abdomen: soft . Skin: no rash seen on limited exam . Musculoskeletal: not rigid . Psychiatric:unable to assess . Neurologic: no seizure no involuntary movements         Lab Data:   Basic Metabolic Panel: Recent Labs  Lab 07/08/18 0501 07/10/18 0448 07/12/18 0616  NA 134* 135 134*  K 3.8 3.8 3.8  CL 94* 96* 98  CO2 25 29 24   GLUCOSE 94 99 105*  BUN 50* 42* 39*  CREATININE 3.33* 2.93* 2.69*  CALCIUM 8.3* 8.2* 8.5*  PHOS 3.2 2.8 2.9    Liver Function Tests: Recent Labs  Lab 07/08/18 0501 07/10/18 0448 07/12/18 0616  ALBUMIN 2.0* 2.0* 2.2*   No results for input(s): LIPASE, AMYLASE in the last 168 hours. No results for input(s): AMMONIA in the last 168 hours.  CBC: Recent Labs  Lab 07/08/18 0501 07/10/18 0448 07/12/18 0616  WBC 13.4* 12.4* 15.2*  HGB 8.9* 8.2* 9.4*  HCT 28.6* 26.2* 30.6*  MCV 94.1 94.6 94.7  PLT 370 332 300    Cardiac Enzymes: No results for input(s): CKTOTAL, CKMB, CKMBINDEX,  TROPONINI in the last 168 hours.  BNP (last 3 results) No results for input(s): BNP in the last 8760 hours.  ProBNP (last 3 results) No results for input(s): PROBNP in the last 8760 hours.  Radiological Exams: No results found.  Assessment/Plan Active Problems:   Acute on chronic respiratory failure with hypoxia (HCC)   End stage renal disease on dialysis (HCC)   Quadriplegia (HCC)   Seizure disorder (HCC)   Pleural effusion, right   Multifocal pneumonia   ARDS (adult respiratory distress syndrome) (HCC)   1. Acute on chronic respiratory failure with hypoxia we will continue with full vent support at this time reassess the spontaneous breathing trial again in the morning.  We will continue pulmonary toilet supportive care 2. End-stage renal disease patient is on dialysis followed by nephrology 3. Seizure disorder quadriplegia at baseline 4. Pleural effusion right-sided we will follow-up x-ray as necessary 5. Multifocal pneumonia treated patient still is vent dependent follow-up x-ray as needed   I have personally seen and evaluated the patient, evaluated laboratory and imaging results, formulated the assessment and plan and placed orders. The Patient requires high complexity decision making for assessment and support.  Case was discussed on Rounds with the Respiratory Therapy Staff  Yevonne PaxSaadat A Riyanna Crutchley, MD [facility] Pulmonary Critical Care Medicine Sleep Medicine

## 2018-07-14 ENCOUNTER — Other Ambulatory Visit (HOSPITAL_COMMUNITY): Payer: Medicare Other

## 2018-07-14 DIAGNOSIS — J189 Pneumonia, unspecified organism: Secondary | ICD-10-CM | POA: Diagnosis not present

## 2018-07-14 DIAGNOSIS — J9621 Acute and chronic respiratory failure with hypoxia: Secondary | ICD-10-CM | POA: Diagnosis not present

## 2018-07-14 DIAGNOSIS — J80 Acute respiratory distress syndrome: Secondary | ICD-10-CM | POA: Diagnosis not present

## 2018-07-14 DIAGNOSIS — N186 End stage renal disease: Secondary | ICD-10-CM | POA: Diagnosis not present

## 2018-07-14 NOTE — Progress Notes (Signed)
[facility] Critical [facility]   PULMONARY SERVICE  PROGRESS NOTE  Date of Service: 07/14/2018  Alec Hammedichard Alred Jr.  ZOX:[id number]  DOB: Aug 20, 1946   DOA: 05-Feb-2018  Referring Physician: Carron CurieAli Hijazi, MD  HPI: Alec HammedRichard Stineman Jr. is a 72 y.o. male seen for follow up of Acute on Chronic Respiratory Failure.  Patient's family was present at the bedside and had a very lengthy discussion with the patient's sister and explained to her the current situation.  Heat patient has failed to wean several times he is chronic dialysis dependent.  Because he has failed to wean he will need to be probably moving on to a skilled nursing facility and unfortunately because of the dialysis requirements he will likely need to be going out of state.  The family however states that the son who is the actual person who makes the decisions has said that he would not want this and also that he would be leaning more towards comfort care and withdrawal of the life support.  I explained to them what this would involve and what to expect and they understood.  In fact the sister stated that she had had to do the same with her father and husband so she knows what the process essentially entails.  Medications: Reviewed on Rounds  Physical Exam:  Vitals: Temperature 99.5 pulse 84 respiratory rate 21 blood pressure 165/65 saturations 99%  Ventilator Settings currently is on assist control FiO2 28% tidal volume 463 PEEP 5  . General: Comfortable at this time . Eyes: Grossly normal lids, irises & conjunctiva . ENT: grossly tongue is normal . Neck: no obvious mass . Cardiovascular: S1 S2 normal no gallop . Respiratory: Coarse breath sounds noted bilaterally . Abdomen: soft . Skin: no rash seen on limited exam . Musculoskeletal: not rigid . Psychiatric:unable to assess . Neurologic: no seizure no involuntary movements         Lab Data:   Basic Metabolic Panel: Recent Labs  Lab  07/08/18 0501 07/10/18 0448 07/12/18 0616  NA 134* 135 134*  K 3.8 3.8 3.8  CL 94* 96* 98  CO2 25 29 24   GLUCOSE 94 99 105*  BUN 50* 42* 39*  CREATININE 3.33* 2.93* 2.69*  CALCIUM 8.3* 8.2* 8.5*  PHOS 3.2 2.8 2.9    Liver Function Tests: Recent Labs  Lab 07/08/18 0501 07/10/18 0448 07/12/18 0616  ALBUMIN 2.0* 2.0* 2.2*   No results for input(s): LIPASE, AMYLASE in the last 168 hours. No results for input(s): AMMONIA in the last 168 hours.  CBC: Recent Labs  Lab 07/08/18 0501 07/10/18 0448 07/12/18 0616  WBC 13.4* 12.4* 15.2*  HGB 8.9* 8.2* 9.4*  HCT 28.6* 26.2* 30.6*  MCV 94.1 94.6 94.7  PLT 370 332 300    Cardiac Enzymes: No results for input(s): CKTOTAL, CKMB, CKMBINDEX, TROPONINI in the last 168 hours.  BNP (last 3 results) No results for input(s): BNP in the last 8760 hours.  ProBNP (last 3 results) No results for input(s): PROBNP in the last 8760 hours.  Radiological Exams: No results found.  Assessment/Plan Active Problems:   Acute on chronic respiratory failure with hypoxia (HCC)   End stage renal disease on dialysis (HCC)   Quadriplegia (HCC)   Seizure disorder (HCC)   Pleural effusion, right   Multifocal pneumonia   ARDS (adult respiratory distress syndrome) (HCC)   1. Acute on chronic respiratory failure with hypoxia has failed to wean failed spontaneous breathing trials.  Patient remains on  full vent support currently in assist control with an FiO2 of 28% tidal volume 463 cc PEEP 5 continue on these current settings. 2. End-stage renal disease on dialysis followed by nephrology. 3. Quadriplegia and seizure disorder no active seizures are noted at this time.  Patient prognosis is guarded. 4. Multifocal pneumonia had a fever once again and has been cultured on primary care team is going to start him on antibiotics 5. Pleural effusion at baseline we will continue with supportive care prognosis guarded   I have personally seen and evaluated  the patient, evaluated laboratory and imaging results, formulated the assessment and plan and placed orders. The Patient requires high complexity decision making for assessment and support.  Case was discussed on Rounds with the Respiratory Therapy Staff time 35 minutes with the patient family conversation and discussion with the medical staff  Yevonne PaxSaadat A Khan, MD [facility] Pulmonary Critical Care Medicine Sleep Medicine

## 2018-07-15 DIAGNOSIS — N186 End stage renal disease: Secondary | ICD-10-CM | POA: Diagnosis not present

## 2018-07-15 DIAGNOSIS — J80 Acute respiratory distress syndrome: Secondary | ICD-10-CM | POA: Diagnosis not present

## 2018-07-15 DIAGNOSIS — J189 Pneumonia, unspecified organism: Secondary | ICD-10-CM | POA: Diagnosis not present

## 2018-07-15 DIAGNOSIS — J9621 Acute and chronic respiratory failure with hypoxia: Secondary | ICD-10-CM | POA: Diagnosis not present

## 2018-07-15 LAB — CBC
HEMATOCRIT: 29 % — AB (ref 39.0–52.0)
Hemoglobin: 8.8 g/dL — ABNORMAL LOW (ref 13.0–17.0)
MCH: 28.8 pg (ref 26.0–34.0)
MCHC: 30.3 g/dL (ref 30.0–36.0)
MCV: 94.8 fL (ref 78.0–100.0)
PLATELETS: 237 10*3/uL (ref 150–400)
RBC: 3.06 MIL/uL — ABNORMAL LOW (ref 4.22–5.81)
RDW: 14.9 % (ref 11.5–15.5)
WBC: 14.2 10*3/uL — AB (ref 4.0–10.5)

## 2018-07-15 LAB — RENAL FUNCTION PANEL
Albumin: 2.2 g/dL — ABNORMAL LOW (ref 3.5–5.0)
Anion gap: 10 (ref 5–15)
BUN: 56 mg/dL — AB (ref 8–23)
CHLORIDE: 97 mmol/L — AB (ref 98–111)
CO2: 30 mmol/L (ref 22–32)
CREATININE: 3.25 mg/dL — AB (ref 0.61–1.24)
Calcium: 8.5 mg/dL — ABNORMAL LOW (ref 8.9–10.3)
GFR, EST AFRICAN AMERICAN: 20 mL/min — AB (ref >60)
GFR, EST NON AFRICAN AMERICAN: 17 mL/min — AB (ref >60)
Glucose, Bld: 109 mg/dL — ABNORMAL HIGH (ref 70–99)
Phosphorus: 3.3 mg/dL (ref 2.5–4.6)
Potassium: 3.7 mmol/L (ref 3.5–5.1)
Sodium: 137 mmol/L (ref 135–145)

## 2018-07-15 NOTE — Progress Notes (Signed)
[facility]   PULMONARY SERVICE  PROGRESS NOTE  Date of Service: 07/15/2018  Alec Hammedichard Osinski Jr.  ZOX:[id number]  DOB: [birth year]   DOA: 01-25-18  Referring Physician: Carron CurieAli Hijazi, MD  HPI: Alec HammedRichard Siddique Jr. is a 72 y.o. male seen for follow up of Acute on Chronic Respiratory Failure.  Patient is on full vent support not weaning.  Had an attempt made at spontaneous breathing trial today and patient developed bradycardia.  Patient is obviously not stable enough to be attempted at weaning at this time.  As noted I spoke with the family yesterday and they are leaning towards comfort care  Medications: Reviewed on Rounds  Physical Exam:  Vitals: Temperature 98.5 pulse 83 respiratory rate 19 blood pressure 154/58 saturations 100%  Ventilator Settings mode of ventilation assist control FiO2 28% tidal volume 461 PEEP 5  . General: Comfortable at this time . Eyes: Grossly normal lids, irises & conjunctiva . ENT: grossly tongue is normal . Neck: no obvious mass . Cardiovascular: S1 S2 normal no gallop . Respiratory: No rhonchi no rales are noted at this time . Abdomen: soft . Skin: no rash seen on limited exam . Musculoskeletal: not rigid . Psychiatric:unable to assess . Neurologic: no seizure no involuntary movements         Lab Data:   Basic Metabolic Panel: Recent Labs  Lab 07/10/18 0448 07/12/18 0616  NA 135 134*  K 3.8 3.8  CL 96* 98  CO2 29 24  GLUCOSE 99 105*  BUN 42* 39*  CREATININE 2.93* 2.69*  CALCIUM 8.2* 8.5*  PHOS 2.8 2.9    Liver Function Tests: Recent Labs  Lab 07/10/18 0448 07/12/18 0616  ALBUMIN 2.0* 2.2*   No results for input(s): LIPASE, AMYLASE in the last 168 hours. No results for input(s): AMMONIA in the last 168 hours.  CBC: Recent Labs  Lab 07/10/18 0448 07/12/18 0616 07/15/18 0731  WBC 12.4* 15.2* 14.2*  HGB 8.2* 9.4* 8.8*  HCT 26.2* 30.6* 29.0*  MCV 94.6 94.7 94.8  PLT 332 300  237    Cardiac Enzymes: No results for input(s): CKTOTAL, CKMB, CKMBINDEX, TROPONINI in the last 168 hours.  BNP (last 3 results) No results for input(s): BNP in the last 8760 hours.  ProBNP (last 3 results) No results for input(s): PROBNP in the last 8760 hours.  Radiological Exams: Dg Chest Port 1 View  Result Date: 07/14/2018 CLINICAL DATA:  Fever EXAM: PORTABLE CHEST 1 VIEW COMPARISON:  July 02, 2018 FINDINGS: Tracheostomy catheter tip is 6.3 cm above the carina. Central catheter tip is in the superior vena cava. No pneumothorax. There are pleural effusions bilaterally with bibasilar airspace consolidation. Lungs elsewhere clear. Heart is mildly enlarged with pulmonary vascularity normal. No adenopathy. There is aortic atherosclerosis. No evident bone lesions. IMPRESSION: Tube and catheter positions as described without evident pneumothorax. There is lower lobe consolidation, suspicious for pneumonia, bilaterally with pleural effusions bilaterally. There may be a degree of superimposed alveolar edema in the bases. There is stable cardiac prominence. There is aortic atherosclerosis. Aortic Atherosclerosis (ICD10-I70.0). Electronically Signed   By: Bretta BangWilliam  Woodruff III M.D.   On: 07/14/2018 15:11    Assessment/Plan Active Problems:   Acute on chronic respiratory failure with hypoxia (HCC)   End stage renal disease on dialysis (HCC)   Quadriplegia (HCC)   Seizure disorder (HCC)   Pleural effusion, right   Multifocal pneumonia   ARDS (adult respiratory distress syndrome) (HCC)   1. Acute  on chronic respiratory failure with hypoxia not amenable continue with full vent support as noted above patient did not tolerate weaning attempt today developed bradycardia continue with supportive care also the last chest x-ray had shown some alveolar edema might need to have nephrology pulse more fluid off if possible but his heart rate is not been stable. 2. End-stage renal disease on hemodialysis  as above continue with supportive care nephrology recommendations. 3. Seizure disorder quadriplegia at baseline continue supportive care 4. Multifocal pneumonia treated with antibiotics he did have a fever yesterday and antibiotics are being reassessed by the primary team 5. Pleural effusion chest x-ray still showing bilateral effusions as noted above   I have personally seen and evaluated the patient, evaluated laboratory and imaging results, formulated the assessment and plan and placed orders. The Patient requires high complexity decision making for assessment and support.  Case was discussed on Rounds with the Respiratory Therapy Staff  Yevonne PaxSaadat A Khan, MD [facility] Pulmonary Critical Care Medicine Sleep Medicine

## 2018-07-15 NOTE — Progress Notes (Signed)
[facility]  ROUNDING NOTE   Subjective:  Patient seen at bedside. Tracheostomy in place. Due for dialysis later today.   Objective:  Vital signs in last 24 hours:  Temperature 98.5 pulse 83 respirations 19 blood pressure 154/58  Physical Exam: General: Critically ill appearing  Head: [place]/AT OM moist  Eyes: anicteric  Neck: Tracheostomy  Lungs:  Ventilator assisted, scattered rhonchi  Heart: S1S2 no rubs  Abdomen:  Soft, nontender, bowel sounds present, PEG in place  Extremities: 1+ peripheral edema  Neurologic: Awake, but not following commands  Skin: No lesions  Access: R IJ permcath    Basic Metabolic Panel: Recent Labs  Lab 07/10/18 0448 07/12/18 0616 07/15/18 0731  NA 135 134* 137  K 3.8 3.8 3.7  CL 96* 98 97*  CO2 29 24 30   GLUCOSE 99 105* 109*  BUN 42* 39* 56*  CREATININE 2.93* 2.69* 3.25*  CALCIUM 8.2* 8.5* 8.5*  PHOS 2.8 2.9 3.3    Liver Function Tests: Recent Labs  Lab 07/10/18 0448 07/12/18 0616 07/15/18 0731  ALBUMIN 2.0* 2.2* 2.2*   No results for input(s): LIPASE, AMYLASE in the last 168 hours. No results for input(s): AMMONIA in the last 168 hours.  CBC: Recent Labs  Lab 07/10/18 0448 07/12/18 0616 07/15/18 0731  WBC 12.4* 15.2* 14.2*  HGB 8.2* 9.4* 8.8*  HCT 26.2* 30.6* 29.0*  MCV 94.6 94.7 94.8  PLT 332 300 237    Cardiac Enzymes: No results for input(s): CKTOTAL, CKMB, CKMBINDEX, TROPONINI in the last 168 hours.  BNP: Invalid input(s): POCBNP  CBG: No results for input(s): GLUCAP in the last 168 hours.  Microbiology: Results for orders placed or performed during the hospital encounter of 06/26/2018  Culture, respiratory (NON-Expectorated)     Status: None   Collection Time: 06/05/2018  4:00 PM  Result Value Ref Range Status   Specimen Description TRACHEAL ASPIRATE  Final   Special Requests NONE  Final   Gram Stain   Final    MODERATE WBC PRESENT,BOTH PMN AND MONONUCLEAR RARE GRAM POSITIVE COCCI    Culture   Final    Consistent with normal respiratory flora. Performed at [facility], [address], [place], [place] [address]    Report Status 06/08/2018 FINAL  Final  Culture, Urine     Status: None   Collection Time: 06/25/2018  6:02 PM  Result Value Ref Range Status   Specimen Description URINE, RANDOM  Final   Special Requests NONE  Final   Culture   Final    NO GROWTH Performed at [facility], [address] [place], [place] [address]    Report Status 06/08/2018 FINAL  Final  C Difficile Quick Screen w PCR reflex     Status: None   Collection Time: 06/08/2018  6:10 PM  Result Value Ref Range Status   C Diff antigen NEGATIVE NEGATIVE Final   C Diff toxin NEGATIVE NEGATIVE Final   C Diff interpretation No C. difficile detected.  Final  Culture, respiratory (NON-Expectorated)     Status: None   Collection Time: 06/11/18  1:23 PM  Result Value Ref Range Status   Specimen Description TRACHEAL ASPIRATE  Final   Special Requests NONE  Final   Gram Stain   Final    ABUNDANT WBC PRESENT,BOTH PMN AND MONONUCLEAR ABUNDANT GRAM POSITIVE COCCI IN PAIRS MODERATE BUDDING YEAST SEEN    Culture   Final    Consistent with normal respiratory flora. Performed at [facility]  [facility], [address] [place], [place] [address]    Report Status 06/13/2018 FINAL  Final  Culture, Urine     Status: None   Collection Time: 06/12/18  6:59 AM  Result Value Ref Range Status   Specimen Description URINE, RANDOM  Final   Special Requests NONE  Final   Culture   Final    NO GROWTH Performed at [facility], [address] [place], [place] [address]    Report Status 06/13/2018 FINAL  Final  Culture, Urine     Status: None   Collection Time: 06/16/18 10:35 AM  Result Value Ref Range Status   Specimen Description URINE, RANDOM  Final   Special Requests NONE  Final   Culture   Final    NO GROWTH Performed at [facility], [address] [place], [place] [address]     Report Status 06/17/2018 FINAL  Final  Culture, blood (routine x 2)     Status: None   Collection Time: 06/16/18 10:39 AM  Result Value Ref Range Status   Specimen Description BLOOD RIGHT ANTECUBITAL  Final   Special Requests   Final    BOTTLES DRAWN AEROBIC ONLY Blood Culture adequate volume   Culture   Final    NO GROWTH 5 DAYS Performed at [facility], [address] [place], [place] [address]    Report Status 06/20/2018 FINAL  Final  Culture, blood (routine x 2)     Status: None   Collection Time: 06/16/18 10:39 AM  Result Value Ref Range Status   Specimen Description BLOOD RIGHT ANTECUBITAL  Final   Special Requests   Final    BOTTLES DRAWN AEROBIC ONLY Blood Culture adequate volume   Culture   Final    NO GROWTH 5 DAYS Performed at [facility], [address] [place], [place] [address]    Report Status 05/27/2018 FINAL  Final  Culture, respiratory (non-expectorated)     Status: None   Collection Time: 06/20/18  7:00 PM  Result Value Ref Range Status   Specimen Description TRACHEAL ASPIRATE  Final   Special Requests NONE  Final   Gram Stain   Final    ABUNDANT WBC PRESENT, PREDOMINANTLY PMN RARE GRAM POSITIVE COCCI    Culture   Final    Consistent with normal respiratory flora. Performed at [facility], [address] [place], [place] [address]    Report Status 06/22/2018 FINAL  Final  C difficile quick scan w PCR reflex     Status: None   Collection Time: 06/23/18 10:26 PM  Result Value Ref Range Status   C Diff antigen NEGATIVE NEGATIVE Final   C Diff toxin NEGATIVE NEGATIVE Final   C Diff interpretation No C. difficile detected.  Final    Comment: Performed at [facility], [address] [place], [place] [address]  Culture, blood (routine x 2)     Status: None   Collection Time: 06/24/18  6:53 AM  Result Value Ref Range Status   Specimen Description BLOOD BLOOD RIGHT FOREARM  Final   Special Requests   Final    BOTTLES DRAWN  AEROBIC ONLY Blood Culture results may not be optimal due to an inadequate volume of blood received in culture bottles   Culture   Final    NO GROWTH 5 DAYS Performed at [facility], [address] [place], [place] [address]    Report  Status 06/29/2018 FINAL  Final  Culture, blood (routine x 2)     Status: None   Collection Time: 06/24/18  6:53 AM  Result Value Ref Range Status   Specimen Description BLOOD RIGHT HAND  Final   Special Requests   Final    BOTTLES DRAWN AEROBIC ONLY Blood Culture results may not be optimal due to an inadequate volume of blood received in culture bottles   Culture   Final    NO GROWTH 5 DAYS Performed at [facility], [address] [place], [place] [address]    Report Status 06/29/2018 FINAL  Final  Culture, body fluid-bottle     Status: None   Collection Time: 06/25/18 12:06 PM  Result Value Ref Range Status   Specimen Description PLEURAL FLUID  Final   Special Requests RIGHT  Final   Culture   Final    NO GROWTH 5 DAYS Performed at [facility], [address] [place], [place] [address]    Report Status 06/30/2018 FINAL  Final  Culture, respiratory (non-expectorated)     Status: None   Collection Time: 06/27/18 12:41 PM  Result Value Ref Range Status   Specimen Description TRACHEAL ASPIRATE  Final   Special Requests NONE  Final   Gram Stain   Final    MODERATE WBC PRESENT, PREDOMINANTLY PMN NO ORGANISMS SEEN    Culture   Final    RARE Consistent with normal respiratory flora. Performed at [facility], [address], [place], [place] [address]    Report Status 06/29/2018 FINAL  Final  C difficile quick scan w PCR reflex     Status: None   Collection Time: 07/03/18 10:21 AM  Result Value Ref Range Status   C Diff antigen NEGATIVE NEGATIVE Final   C Diff toxin NEGATIVE NEGATIVE Final   C Diff interpretation No C. difficile detected.  Final    Comment: Performed at [facility], [address],  [place], [place] [address]  Culture, respiratory (non-expectorated)     Status: None (Preliminary result)   Collection Time: 07/14/18 12:15 PM  Result Value Ref Range Status   Specimen Description TRACHEAL ASPIRATE  Final   Special Requests NONE  Final   Gram Stain   Final    RARE WBC PRESENT, PREDOMINANTLY PMN NO ORGANISMS SEEN    Culture   Final    CULTURE REINCUBATED FOR BETTER GROWTH Performed at [facility], [address], [place], [place] [address]    Report Status PENDING  Incomplete  Culture, blood (routine x 2)     Status: None (Preliminary result)   Collection Time: 07/14/18 12:24 PM  Result Value Ref Range Status   Specimen Description BLOOD RIGHT HAND  Final   Special Requests   Final    BOTTLES DRAWN AEROBIC ONLY Blood Culture adequate volume   Culture   Final    NO GROWTH < 24 HOURS Performed at [facility], [address] [place], [place] [address]    Report Status PENDING  Incomplete  Culture, blood (routine x 2)     Status: None (Preliminary result)   Collection Time: 07/14/18 12:24 PM  Result Value Ref Range Status   Specimen Description BLOOD RIGHT HAND  Final   Special Requests   Final    BOTTLES DRAWN AEROBIC ONLY Blood Culture results may not be optimal due to an inadequate volume of blood received in culture bottles   Culture  Final    NO GROWTH < 24 HOURS Performed at [facility], [address] [place], [place] [address]    Report Status PENDING  Incomplete    Coagulation Studies: No results for input(s): LABPROT, INR in the last 72 hours.  Urinalysis: No results for input(s): COLORURINE, LABSPEC, PHURINE, GLUCOSEU, HGBUR, BILIRUBINUR, KETONESUR, PROTEINUR, UROBILINOGEN, NITRITE, LEUKOCYTESUR in the last 72 hours.  Invalid input(s): APPERANCEUR    Imaging: Dg Chest Port 1 View  Result Date: 07/14/2018 CLINICAL DATA:  Fever EXAM: PORTABLE CHEST 1 VIEW COMPARISON:  July 02, 2018 FINDINGS: Tracheostomy catheter tip is 6.3 cm  above the carina. Central catheter tip is in the superior vena cava. No pneumothorax. There are pleural effusions bilaterally with bibasilar airspace consolidation. Lungs elsewhere clear. Heart is mildly enlarged with pulmonary vascularity normal. No adenopathy. There is aortic atherosclerosis. No evident bone lesions. IMPRESSION: Tube and catheter positions as described without evident pneumothorax. There is lower lobe consolidation, suspicious for pneumonia, bilaterally with pleural effusions bilaterally. There may be a degree of superimposed alveolar edema in the bases. There is stable cardiac prominence. There is aortic atherosclerosis. Aortic Atherosclerosis (ICD10-I70.0). Electronically Signed   By: Bretta BangWilliam  Woodruff III M.D.   On: 07/14/2018 15:11     Medications:       Assessment/ Plan:  72 y.o. male with a PMHx of ESRD on HD MWF prior to admission here, seizure disorder, hypertension, anemia chronic kidney disease, secondary hyperparathyroidism, who was admitted to [facility] on 06/11/2018 for ongoing treatment of acute respiratory failure, paraplegia with minimal right arm movement, end-stage renal disease.   1.  ESRD with right internal jugular PermCath.     -Patient due for hemodialysis today.  Orders have been prepared.  2. Anemia chronic kidney disease.    -Hemoglobin down slightly to 8.8.  Likely hemodilution all over the weekend.  Continue to monitor CBC.  3.  Secondary hyperparathyroidism.    Serum phosphorus acceptable at 3.3.  Continue to check phosphorus and calcium periodically.  4.  Acute respiratory failure.    Patient remains on the ventilator at 28% FiO2 with PEEP of 5.  Continue ventilatory support.  5.  Fever.  WBC count down to 14.2.  Blood cultures thus far negative.    LOS: 0 Alec Bush 8/19/20192:40 PM

## 2018-07-16 DIAGNOSIS — J9621 Acute and chronic respiratory failure with hypoxia: Secondary | ICD-10-CM | POA: Diagnosis not present

## 2018-07-16 DIAGNOSIS — N186 End stage renal disease: Secondary | ICD-10-CM | POA: Diagnosis not present

## 2018-07-16 DIAGNOSIS — J189 Pneumonia, unspecified organism: Secondary | ICD-10-CM | POA: Diagnosis not present

## 2018-07-16 DIAGNOSIS — J80 Acute respiratory distress syndrome: Secondary | ICD-10-CM | POA: Diagnosis not present

## 2018-07-16 LAB — URINALYSIS, ROUTINE W REFLEX MICROSCOPIC
BILIRUBIN URINE: NEGATIVE
Glucose, UA: NEGATIVE mg/dL
HGB URINE DIPSTICK: NEGATIVE
KETONES UR: NEGATIVE mg/dL
Leukocytes, UA: NEGATIVE
NITRITE: NEGATIVE
Protein, ur: 100 mg/dL — AB
SPECIFIC GRAVITY, URINE: 1.009 (ref 1.005–1.030)
pH: 8 (ref 5.0–8.0)

## 2018-07-16 NOTE — Progress Notes (Signed)
Pulmonary Critical Care Medicine [facility]   PULMONARY SERVICE  PROGRESS NOTE  Date of Service: 07/16/2018  Carley Hammedichard Rovner Jr.  WJX:[id number]  DOB: 10/01/1946   DOA: 06/08/2018  Referring Physician: Carron CurieAli Hijazi, MD  HPI: Carley HammedRichard Mcginnis Jr. is a 72 y.o. male seen for follow up of Acute on Chronic Respiratory Failure.  Patient currently is on full support on assist control mode has been on 28% FiO2.  Was attempted on pressure support and failed  Medications: Reviewed on Rounds  Physical Exam:  Vitals: Temperature 97.5 pulse 84 respiratory 24 blood pressure 151/67 saturations 98%  Ventilator Settings mode of ventilation assist control FiO2 28% tidal volume 397 PEEP 5  . General: Comfortable at this time . Eyes: Grossly normal lids, irises & conjunctiva . ENT: grossly tongue is normal . Neck: no obvious mass . Cardiovascular: S1 S2 normal no gallop . Respiratory: Coarse breath sounds no rhonchi . Abdomen: soft . Skin: no rash seen on limited exam . Musculoskeletal: not rigid . Psychiatric:unable to assess . Neurologic: no seizure no involuntary movements         Lab Data:   Basic Metabolic Panel: Recent Labs  Lab 07/10/18 0448 07/12/18 0616 07/15/18 0731  NA 135 134* 137  K 3.8 3.8 3.7  CL 96* 98 97*  CO2 29 24 30   GLUCOSE 99 105* 109*  BUN 42* 39* 56*  CREATININE 2.93* 2.69* 3.25*  CALCIUM 8.2* 8.5* 8.5*  PHOS 2.8 2.9 3.3    Liver Function Tests: Recent Labs  Lab 07/10/18 0448 07/12/18 0616 07/15/18 0731  ALBUMIN 2.0* 2.2* 2.2*   No results for input(s): LIPASE, AMYLASE in the last 168 hours. No results for input(s): AMMONIA in the last 168 hours.  CBC: Recent Labs  Lab 07/10/18 0448 07/12/18 0616 07/15/18 0731  WBC 12.4* 15.2* 14.2*  HGB 8.2* 9.4* 8.8*  HCT 26.2* 30.6* 29.0*  MCV 94.6 94.7 94.8  PLT 332 300 237    Cardiac Enzymes: No results for input(s): CKTOTAL, CKMB, CKMBINDEX, TROPONINI in the last 168 hours.  BNP  (last 3 results) No results for input(s): BNP in the last 8760 hours.  ProBNP (last 3 results) No results for input(s): PROBNP in the last 8760 hours.  Radiological Exams: Dg Chest Port 1 View  Result Date: 07/14/2018 CLINICAL DATA:  Fever EXAM: PORTABLE CHEST 1 VIEW COMPARISON:  July 02, 2018 FINDINGS: Tracheostomy catheter tip is 6.3 cm above the carina. Central catheter tip is in the superior vena cava. No pneumothorax. There are pleural effusions bilaterally with bibasilar airspace consolidation. Lungs elsewhere clear. Heart is mildly enlarged with pulmonary vascularity normal. No adenopathy. There is aortic atherosclerosis. No evident bone lesions. IMPRESSION: Tube and catheter positions as described without evident pneumothorax. There is lower lobe consolidation, suspicious for pneumonia, bilaterally with pleural effusions bilaterally. There may be a degree of superimposed alveolar edema in the bases. There is stable cardiac prominence. There is aortic atherosclerosis. Aortic Atherosclerosis (ICD10-I70.0). Electronically Signed   By: Bretta BangWilliam  Woodruff III M.D.   On: 07/14/2018 15:11    Assessment/Plan Active Problems:   Acute on chronic respiratory failure with hypoxia (HCC)   End stage renal disease on dialysis (HCC)   Quadriplegia (HCC)   Seizure disorder (HCC)   Pleural effusion, right   Multifocal pneumonia   ARDS (adult respiratory distress syndrome) (HCC)   1. Acute on chronic respiratory failure with hypoxia we will continue with assessing the PSP however it is noted patient has failed multiple  times yesterday bradycardic event.  The family is deciding on making the patient possibly comfort care. 2. End-stage renal disease on dialysis followed by nephrology until we have word from the family will continue with full support 3. Seizure disorder quadriplegia grossly unchanged 4. Multifocal pneumonia treated with antibiotics we will continue to follow   I have personally seen  and evaluated the patient, evaluated laboratory and imaging results, formulated the assessment and plan and placed orders. The Patient requires high complexity decision making for assessment and support.  Case was discussed on Rounds with the Respiratory Therapy Staff  Yevonne PaxSaadat A Leshonda Galambos, MD [facility] Pulmonary Critical Care Medicine Sleep Medicine

## 2018-07-17 DIAGNOSIS — J80 Acute respiratory distress syndrome: Secondary | ICD-10-CM | POA: Diagnosis not present

## 2018-07-17 DIAGNOSIS — J189 Pneumonia, unspecified organism: Secondary | ICD-10-CM | POA: Diagnosis not present

## 2018-07-17 DIAGNOSIS — N186 End stage renal disease: Secondary | ICD-10-CM | POA: Diagnosis not present

## 2018-07-17 DIAGNOSIS — J9621 Acute and chronic respiratory failure with hypoxia: Secondary | ICD-10-CM | POA: Diagnosis not present

## 2018-07-17 LAB — CULTURE, RESPIRATORY W GRAM STAIN: Culture: NORMAL

## 2018-07-17 LAB — RENAL FUNCTION PANEL
Albumin: 2 g/dL — ABNORMAL LOW (ref 3.5–5.0)
Anion gap: 6 (ref 5–15)
BUN: 44 mg/dL — AB (ref 8–23)
CO2: 30 mmol/L (ref 22–32)
Calcium: 7.8 mg/dL — ABNORMAL LOW (ref 8.9–10.3)
Chloride: 99 mmol/L (ref 98–111)
Creatinine, Ser: 2.72 mg/dL — ABNORMAL HIGH (ref 0.61–1.24)
GFR calc Af Amer: 25 mL/min — ABNORMAL LOW (ref >60)
GFR, EST NON AFRICAN AMERICAN: 22 mL/min — AB (ref >60)
Glucose, Bld: 98 mg/dL (ref 70–99)
Phosphorus: 2.3 mg/dL — ABNORMAL LOW (ref 2.5–4.6)
Potassium: 3.4 mmol/L — ABNORMAL LOW (ref 3.5–5.1)
Sodium: 135 mmol/L (ref 135–145)

## 2018-07-17 LAB — CBC
HEMATOCRIT: 27.3 % — AB (ref 39.0–52.0)
Hemoglobin: 8.6 g/dL — ABNORMAL LOW (ref 13.0–17.0)
MCH: 29.7 pg (ref 26.0–34.0)
MCHC: 31.5 g/dL (ref 30.0–36.0)
MCV: 94.1 fL (ref 78.0–100.0)
Platelets: 207 10*3/uL (ref 150–400)
RBC: 2.9 MIL/uL — ABNORMAL LOW (ref 4.22–5.81)
RDW: 14.6 % (ref 11.5–15.5)
WBC: 12.5 10*3/uL — AB (ref 4.0–10.5)

## 2018-07-17 LAB — URINE CULTURE: Culture: <10000 — AB

## 2018-07-17 LAB — CULTURE, RESPIRATORY

## 2018-07-17 LAB — VANCOMYCIN, TROUGH: Vancomycin Tr: 8 ug/mL — ABNORMAL LOW (ref 15–20)

## 2018-07-17 NOTE — Progress Notes (Signed)
[facility]   PULMONARY SERVICE  PROGRESS NOTE  Date of Service: 07/17/2018  Alec Hammedichard Joubert Jr.  ZOX:[id number]  DOB: 09/09/1946   DOA: 08-31-18  Referring Physician: Carron CurieAli Hijazi, MD  HPI: Alec HammedRichard Metsker Jr. is a 72 y.o. male seen for follow up of Acute on Chronic Respiratory Failure.  Patient right now is on full support on assist control mode has been on 28% FiO2.  Son apparently wants to withdraw life support.  The entire family is in agreement with this decision  Medications: Reviewed on Rounds  Physical Exam:  Vitals: Temperature 98.0 pulse 89 respiratory rate 21 blood pressure 155/74 saturations 100%  Ventilator Settings mode of ventilation assist control FiO2 20% tidal volume 455 PEEP 5  . General: Comfortable at this time . Eyes: Grossly normal lids, irises & conjunctiva . ENT: grossly tongue is normal . Neck: no obvious mass . Cardiovascular: S1 S2 normal no gallop . Respiratory: Coarse breath sounds no rhonchi . Abdomen: soft . Skin: no rash seen on limited exam . Musculoskeletal: not rigid . Psychiatric:unable to assess . Neurologic: no seizure no involuntary movements         Lab Data:   Basic Metabolic Panel: Recent Labs  Lab 07/12/18 0616 07/15/18 0731 07/17/18 0444  NA 134* 137 135  K 3.8 3.7 3.4*  CL 98 97* 99  CO2 24 30 30   GLUCOSE 105* 109* 98  BUN 39* 56* 44*  CREATININE 2.69* 3.25* 2.72*  CALCIUM 8.5* 8.5* 7.8*  PHOS 2.9 3.3 2.3*    Liver Function Tests: Recent Labs  Lab 07/12/18 0616 07/15/18 0731 07/17/18 0444  ALBUMIN 2.2* 2.2* 2.0*   No results for input(s): LIPASE, AMYLASE in the last 168 hours. No results for input(s): AMMONIA in the last 168 hours.  CBC: Recent Labs  Lab 07/12/18 0616 07/15/18 0731 07/17/18 0444  WBC 15.2* 14.2* 12.5*  HGB 9.4* 8.8* 8.6*  HCT 30.6* 29.0* 27.3*  MCV 94.7 94.8 94.1  PLT 300 237 207    Cardiac Enzymes: No results for input(s):  CKTOTAL, CKMB, CKMBINDEX, TROPONINI in the last 168 hours.  BNP (last 3 results) No results for input(s): BNP in the last 8760 hours.  ProBNP (last 3 results) No results for input(s): PROBNP in the last 8760 hours.  Radiological Exams: No results found.  Assessment/Plan Active Problems:   Acute on chronic respiratory failure with hypoxia (HCC)   End stage renal disease on dialysis (HCC)   Quadriplegia (HCC)   Seizure disorder (HCC)   Pleural effusion, right   Multifocal pneumonia   ARDS (adult respiratory distress syndrome) (HCC)   1. Acute on chronic respiratory failure with hypoxia continue with full support for now.  Will await word from family regarding withdrawal of life support.  Continue with aggressive pulmonary toilet supportive care. 2. Seizure disorder quadriplegia he is at baseline continue with supportive care 3. Multifocal pneumonia treated we will monitor 4. ARDS at baseline 5. Right pleural effusion at baseline 6. End-stage renal disease patient has been on dialysis   I have personally seen and evaluated the patient, evaluated laboratory and imaging results, formulated the assessment and plan and placed orders. The Patient requires high complexity decision making for assessment and support.  Case was discussed on Rounds with the Respiratory Therapy Staff  Yevonne PaxSaadat A Inna Tisdell, MD [facility] Pulmonary Critical Care Medicine Sleep Medicine

## 2018-07-17 NOTE — Progress Notes (Signed)
[facility]  ROUNDING NOTE   Subjective:  Patient seen at bedside. Still on the ventilator and remains critically ill. FiO2 currently 48%.   Objective:  Vital signs in last 24 hours:  Temperature 98 pulse 87 respirations 20 blood pressure 155/74   Physical Exam: General: Critically ill appearing  Head: [place]/AT OM moist  Eyes: anicteric  Neck: Tracheostomy  Lungs:  Ventilator assisted, scattered rhonchi  Heart: S1S2 no rubs  Abdomen:  Soft, nontender, bowel sounds present, PEG in place  Extremities: 1+ peripheral edema  Neurologic: Awake, but not following commands  Skin: No lesions  Access: R IJ permcath    Basic Metabolic Panel: Recent Labs  Lab 07/12/18 0616 07/15/18 0731 07/17/18 0444  NA 134* 137 135  K 3.8 3.7 3.4*  CL 98 97* 99  CO2 24 30 30   GLUCOSE 105* 109* 98  BUN 39* 56* 44*  CREATININE 2.69* 3.25* 2.72*  CALCIUM 8.5* 8.5* 7.8*  PHOS 2.9 3.3 2.3*    Liver Function Tests: Recent Labs  Lab 07/12/18 0616 07/15/18 0731 07/17/18 0444  ALBUMIN 2.2* 2.2* 2.0*   No results for input(s): LIPASE, AMYLASE in the last 168 hours. No results for input(s): AMMONIA in the last 168 hours.  CBC: Recent Labs  Lab 07/12/18 0616 07/15/18 0731 07/17/18 0444  WBC 15.2* 14.2* 12.5*  HGB 9.4* 8.8* 8.6*  HCT 30.6* 29.0* 27.3*  MCV 94.7 94.8 94.1  PLT 300 237 207    Cardiac Enzymes: No results for input(s): CKTOTAL, CKMB, CKMBINDEX, TROPONINI in the last 168 hours.  BNP: Invalid input(s): POCBNP  CBG: No results for input(s): GLUCAP in the last 168 hours.  Microbiology: Results for orders placed or performed during the hospital encounter of 06/12/2018  Culture, respiratory (NON-Expectorated)     Status: None   Collection Time: 06/15/2018  4:00 PM  Result Value Ref Range Status   Specimen Description TRACHEAL ASPIRATE  Final   Special Requests NONE  Final   Gram Stain   Final    MODERATE WBC PRESENT,BOTH PMN AND MONONUCLEAR RARE GRAM  POSITIVE COCCI    Culture   Final    Consistent with normal respiratory flora. Performed at [facility], [address] [place], [place] [address]    Report Status 06/08/2018 FINAL  Final  Culture, Urine     Status: None   Collection Time: 06/15/2018  6:02 PM  Result Value Ref Range Status   Specimen Description URINE, RANDOM  Final   Special Requests NONE  Final   Culture   Final    NO GROWTH Performed at [facility], [address] [place], [place] [address]    Report Status 06/08/2018 FINAL  Final  C Difficile Quick Screen w PCR reflex     Status: None   Collection Time: 06/12/2018  6:10 PM  Result Value Ref Range Status   C Diff antigen NEGATIVE NEGATIVE Final   C Diff toxin NEGATIVE NEGATIVE Final   C Diff interpretation No C. difficile detected.  Final  Culture, respiratory (NON-Expectorated)     Status: None   Collection Time: 06/11/18  1:23 PM  Result Value Ref Range Status   Specimen Description TRACHEAL ASPIRATE  Final   Special Requests NONE  Final   Gram Stain   Final    ABUNDANT WBC PRESENT,BOTH PMN AND MONONUCLEAR ABUNDANT GRAM POSITIVE COCCI IN PAIRS MODERATE BUDDING YEAST SEEN    Culture   Final    Consistent with normal  respiratory flora. Performed at [facility], [address], [place], [place] [address]    Report Status 06/13/2018 FINAL  Final  Culture, Urine     Status: None   Collection Time: 06/12/18  6:59 AM  Result Value Ref Range Status   Specimen Description URINE, RANDOM  Final   Special Requests NONE  Final   Culture   Final    NO GROWTH Performed at [facility], [address] [place], [place] [address]    Report Status 06/13/2018 FINAL  Final  Culture, Urine     Status: None   Collection Time: 06/16/18 10:35 AM  Result Value Ref Range Status   Specimen Description URINE, RANDOM  Final   Special Requests NONE  Final   Culture   Final    NO GROWTH Performed at [facility], [address]  [place], [place] [address]    Report Status 06/17/2018 FINAL  Final  Culture, blood (routine x 2)     Status: None   Collection Time: 06/16/18 10:39 AM  Result Value Ref Range Status   Specimen Description BLOOD RIGHT ANTECUBITAL  Final   Special Requests   Final    BOTTLES DRAWN AEROBIC ONLY Blood Culture adequate volume   Culture   Final    NO GROWTH 5 DAYS Performed at [facility], [address], [place], [place] [address]    Report Status 16-Jul-2018 FINAL  Final  Culture, blood (routine x 2)     Status: None   Collection Time: 06/16/18 10:39 AM  Result Value Ref Range Status   Specimen Description BLOOD RIGHT ANTECUBITAL  Final   Special Requests   Final    BOTTLES DRAWN AEROBIC ONLY Blood Culture adequate volume   Culture   Final    NO GROWTH 5 DAYS Performed at [facility], [address] [place], [place] [address]    Report Status 07/16/18 FINAL  Final  Culture, respiratory (non-expectorated)     Status: None   Collection Time: 06/20/18  7:00 PM  Result Value Ref Range Status   Specimen Description TRACHEAL ASPIRATE  Final   Special Requests NONE  Final   Gram Stain   Final    ABUNDANT WBC PRESENT, PREDOMINANTLY PMN RARE GRAM POSITIVE COCCI    Culture   Final    Consistent with normal respiratory flora. Performed at [facility], [address] [place], [place] [address]    Report Status 06/22/2018 FINAL  Final  C difficile quick scan w PCR reflex     Status: None   Collection Time: 06/23/18 10:26 PM  Result Value Ref Range Status   C Diff antigen NEGATIVE NEGATIVE Final   C Diff toxin NEGATIVE NEGATIVE Final   C Diff interpretation No C. difficile detected.  Final    Comment: Performed at [facility], [address], [place], [place] [address]  Culture, blood (routine x 2)     Status: None   Collection Time: 06/24/18  6:53 AM  Result Value Ref Range Status   Specimen Description BLOOD BLOOD RIGHT FOREARM  Final   Special Requests    Final    BOTTLES DRAWN AEROBIC ONLY Blood Culture results may not be optimal due to an inadequate volume of blood received in culture bottles   Culture   Final    NO GROWTH 5 DAYS Performed at [facility], [address], [place], [place]  [id number]    Report Status 06/29/2018 FINAL  Final  Culture, blood (routine x 2)     Status: None   Collection Time: 06/24/18  6:53 AM  Result Value Ref Range Status   Specimen Description BLOOD RIGHT HAND  Final   Special Requests   Final    BOTTLES DRAWN AEROBIC ONLY Blood Culture results may not be optimal due to an inadequate volume of blood received in culture bottles   Culture   Final    NO GROWTH 5 DAYS Performed at [facility], [address] [place], [place] [address]    Report Status 06/29/2018 FINAL  Final  Culture, body fluid-bottle     Status: None   Collection Time: 06/25/18 12:06 PM  Result Value Ref Range Status   Specimen Description PLEURAL FLUID  Final   Special Requests RIGHT  Final   Culture   Final    NO GROWTH 5 DAYS Performed at [facility], [address] [place], [place] [address]    Report Status 06/30/2018 FINAL  Final  Culture, respiratory (non-expectorated)     Status: None   Collection Time: 06/27/18 12:41 PM  Result Value Ref Range Status   Specimen Description TRACHEAL ASPIRATE  Final   Special Requests NONE  Final   Gram Stain   Final    MODERATE WBC PRESENT, PREDOMINANTLY PMN NO ORGANISMS SEEN    Culture   Final    RARE Consistent with normal respiratory flora. Performed at [facility], [address] [place], [place] [address]    Report Status 06/29/2018 FINAL  Final  C difficile quick scan w PCR reflex     Status: None   Collection Time: 07/03/18 10:21 AM  Result Value Ref Range Status   C Diff antigen NEGATIVE NEGATIVE Final   C Diff toxin NEGATIVE NEGATIVE Final   C Diff interpretation No C. difficile detected.  Final    Comment: Performed at [facility], [address] [place], [place] [address]  Culture, respiratory (non-expectorated)     Status: None   Collection Time: 07/14/18 12:15 PM  Result Value Ref Range Status   Specimen Description TRACHEAL ASPIRATE  Final   Special Requests NONE  Final   Gram Stain   Final    RARE WBC PRESENT, PREDOMINANTLY PMN NO ORGANISMS SEEN    Culture   Final    FEW Consistent with normal respiratory flora. Performed at [facility], [address] [place], [place] [address]    Report Status 07/17/2018 FINAL  Final  Culture, blood (routine x 2)     Status: None (Preliminary result)   Collection Time: 07/14/18 12:24 PM  Result Value Ref Range Status   Specimen Description BLOOD RIGHT HAND  Final   Special Requests   Final    BOTTLES DRAWN AEROBIC ONLY Blood Culture adequate volume   Culture   Final    NO GROWTH 3 DAYS Performed at [facility], [address] [place], [place] [address]    Report Status PENDING  Incomplete  Culture, blood (routine x 2)     Status: None (Preliminary result)   Collection Time: 07/14/18 12:24 PM  Result Value Ref Range Status   Specimen Description BLOOD RIGHT HAND  Final   Special Requests   Final    BOTTLES DRAWN AEROBIC ONLY Blood Culture results may not be optimal due to an inadequate volume of blood received in culture  bottles   Culture   Final    NO GROWTH 3 DAYS Performed at [facility], [address] [place], [place] [address]    Report Status PENDING  Incomplete  Culture, Urine     Status: Abnormal   Collection Time: 07/16/18  2:15 PM  Result Value Ref Range Status   Specimen Description URINE, RANDOM  Final   Special Requests NONE  Final   Culture (A)  Final    <10,000 COLONIES/mL INSIGNIFICANT GROWTH Performed at [facility], [address] [place], [place] [address]    Report Status 07/17/2018 FINAL  Final    Coagulation Studies: No results for input(s): LABPROT, INR in the last 72 hours.  Urinalysis: No  results for input(s): COLORURINE, LABSPEC, PHURINE, GLUCOSEU, HGBUR, BILIRUBINUR, KETONESUR, PROTEINUR, UROBILINOGEN, NITRITE, LEUKOCYTESUR in the last 72 hours.  Invalid input(s): APPERANCEUR    Imaging: No results found.   Medications:       Assessment/ Plan:  72 y.o. male with a PMHx of ESRD on HD MWF prior to admission here, seizure disorder, hypertension, anemia chronic kidney disease, secondary hyperparathyroidism, who was admitted to [facility] on 05/27/2018 for ongoing treatment of acute respiratory failure, paraplegia with minimal right arm movement, end-stage renal disease.   1.  ESRD with right internal jugular PermCath.     -Patient will be due for hemodialysis today.  Continue dialysis on MWF schedule for now.  Family potentially considering comfort care.  2. Anemia chronic kidney disease.    -Hemoglobin currently 8.6.  Slight drop as compared to Monday.  Recheck on Friday.  3.  Secondary hyperparathyroidism.    Phosphorus 2.3 at last check.  Repeat serum phosphorus on Friday.  4.  Acute respiratory failure.    Patient unlikely to be weaned from the ventilator.  He remains on FiO2 of 28% with a PEEP of 5.  Family considering comfort care.  5.  Leukocytosis.  WBC count slightly down to 12.5.  Blood cultures were recently negative.   LOS: 0 Maura Braaten 8/21/20191:21 PM

## 2018-07-18 DIAGNOSIS — J80 Acute respiratory distress syndrome: Secondary | ICD-10-CM | POA: Diagnosis not present

## 2018-07-18 DIAGNOSIS — N186 End stage renal disease: Secondary | ICD-10-CM | POA: Diagnosis not present

## 2018-07-18 DIAGNOSIS — J189 Pneumonia, unspecified organism: Secondary | ICD-10-CM | POA: Diagnosis not present

## 2018-07-18 DIAGNOSIS — J9621 Acute and chronic respiratory failure with hypoxia: Secondary | ICD-10-CM | POA: Diagnosis not present

## 2018-07-19 LAB — CULTURE, BLOOD (ROUTINE X 2)
CULTURE: NO GROWTH
Culture: NO GROWTH
SPECIAL REQUESTS: ADEQUATE

## 2018-07-28 NOTE — Progress Notes (Signed)
Pulmonary Critical Care Medicine [facility]   PULMONARY SERVICE  PROGRESS NOTE  Date of Service: 07/12/2018  Alec Hammedichard Cofield Jr.  ZOX:[id number]  DOB: 1946-08-07   DOA: 06/05/2018  Referring Physician: Carron CurieAli Hijazi, MD  HPI: Alec HammedRichard Valadez Jr. is a 72 y.o. male seen for follow up of Acute on Chronic Respiratory Failure.  Patient is resting comfortably now comfort care off the ventilator  Medications: Reviewed on Rounds  Physical Exam:  Vitals: Temperature 97.4 pulse 85 respiratory 24 blood pressure 137/64 saturations 97%    . General: Comfortable at this time . Eyes: Grossly normal lids, irises & conjunctiva . ENT: grossly tongue is normal . Neck: no obvious mass . Cardiovascular: S1 S2 normal no gallop . Respiratory: No rhonchi no rales are noted . Abdomen: soft . Skin: no rash seen on limited exam . Musculoskeletal: not rigid . Psychiatric:unable to assess . Neurologic: no seizure no involuntary movements         Lab Data:   Basic Metabolic Panel: Recent Labs  Lab 07/12/18 0616 07/15/18 0731 07/17/18 0444  NA 134* 137 135  K 3.8 3.7 3.4*  CL 98 97* 99  CO2 24 30 30   GLUCOSE 105* 109* 98  BUN 39* 56* 44*  CREATININE 2.69* 3.25* 2.72*  CALCIUM 8.5* 8.5* 7.8*  PHOS 2.9 3.3 2.3*    Liver Function Tests: Recent Labs  Lab 07/12/18 0616 07/15/18 0731 07/17/18 0444  ALBUMIN 2.2* 2.2* 2.0*   No results for input(s): LIPASE, AMYLASE in the last 168 hours. No results for input(s): AMMONIA in the last 168 hours.  CBC: Recent Labs  Lab 07/12/18 0616 07/15/18 0731 07/17/18 0444  WBC 15.2* 14.2* 12.5*  HGB 9.4* 8.8* 8.6*  HCT 30.6* 29.0* 27.3*  MCV 94.7 94.8 94.1  PLT 300 237 207    Cardiac Enzymes: No results for input(s): CKTOTAL, CKMB, CKMBINDEX, TROPONINI in the last 168 hours.  BNP (last 3 results) No results for input(s): BNP in the last 8760 hours.  ProBNP (last 3 results) No results for input(s): PROBNP in the last 8760  hours.  Radiological Exams: No results found.  Assessment/Plan Active Problems:   Acute on chronic respiratory failure with hypoxia (HCC)   End stage renal disease on dialysis (HCC)   Quadriplegia (HCC)   Seizure disorder (HCC)   Pleural effusion, right   Multifocal pneumonia   ARDS (adult respiratory distress syndrome) (HCC)   1. Acute on chronic respiratory failure with hypoxia patient's ventilator will be withdrawn patient will be comfort care at this time do not expect that the patient will survive through the next 24 hours. 2. Quadriplegia at baseline seizure disorder at baseline 3. End-stage renal disease discontinue dialysis   I have personally seen and evaluated the patient, evaluated laboratory and imaging results, formulated the assessment and plan and placed orders. The Patient requires high complexity decision making for assessment and support.  Case was discussed on Rounds with the Respiratory Therapy Staff  Yevonne PaxSaadat A Chananya Canizalez, MD [facility] Pulmonary Critical Care Medicine Sleep Medicine

## 2018-07-28 DEATH — deceased

## 2020-05-13 IMAGING — DX DG ABD PORTABLE 1V
1 series · 1 of 1 positions shown · non-contrast
Comparison: None.

CLINICAL DATA: Orogastric tube placement.

EXAM:
PORTABLE ABDOMEN - 1 VIEW

[abdomen kub]
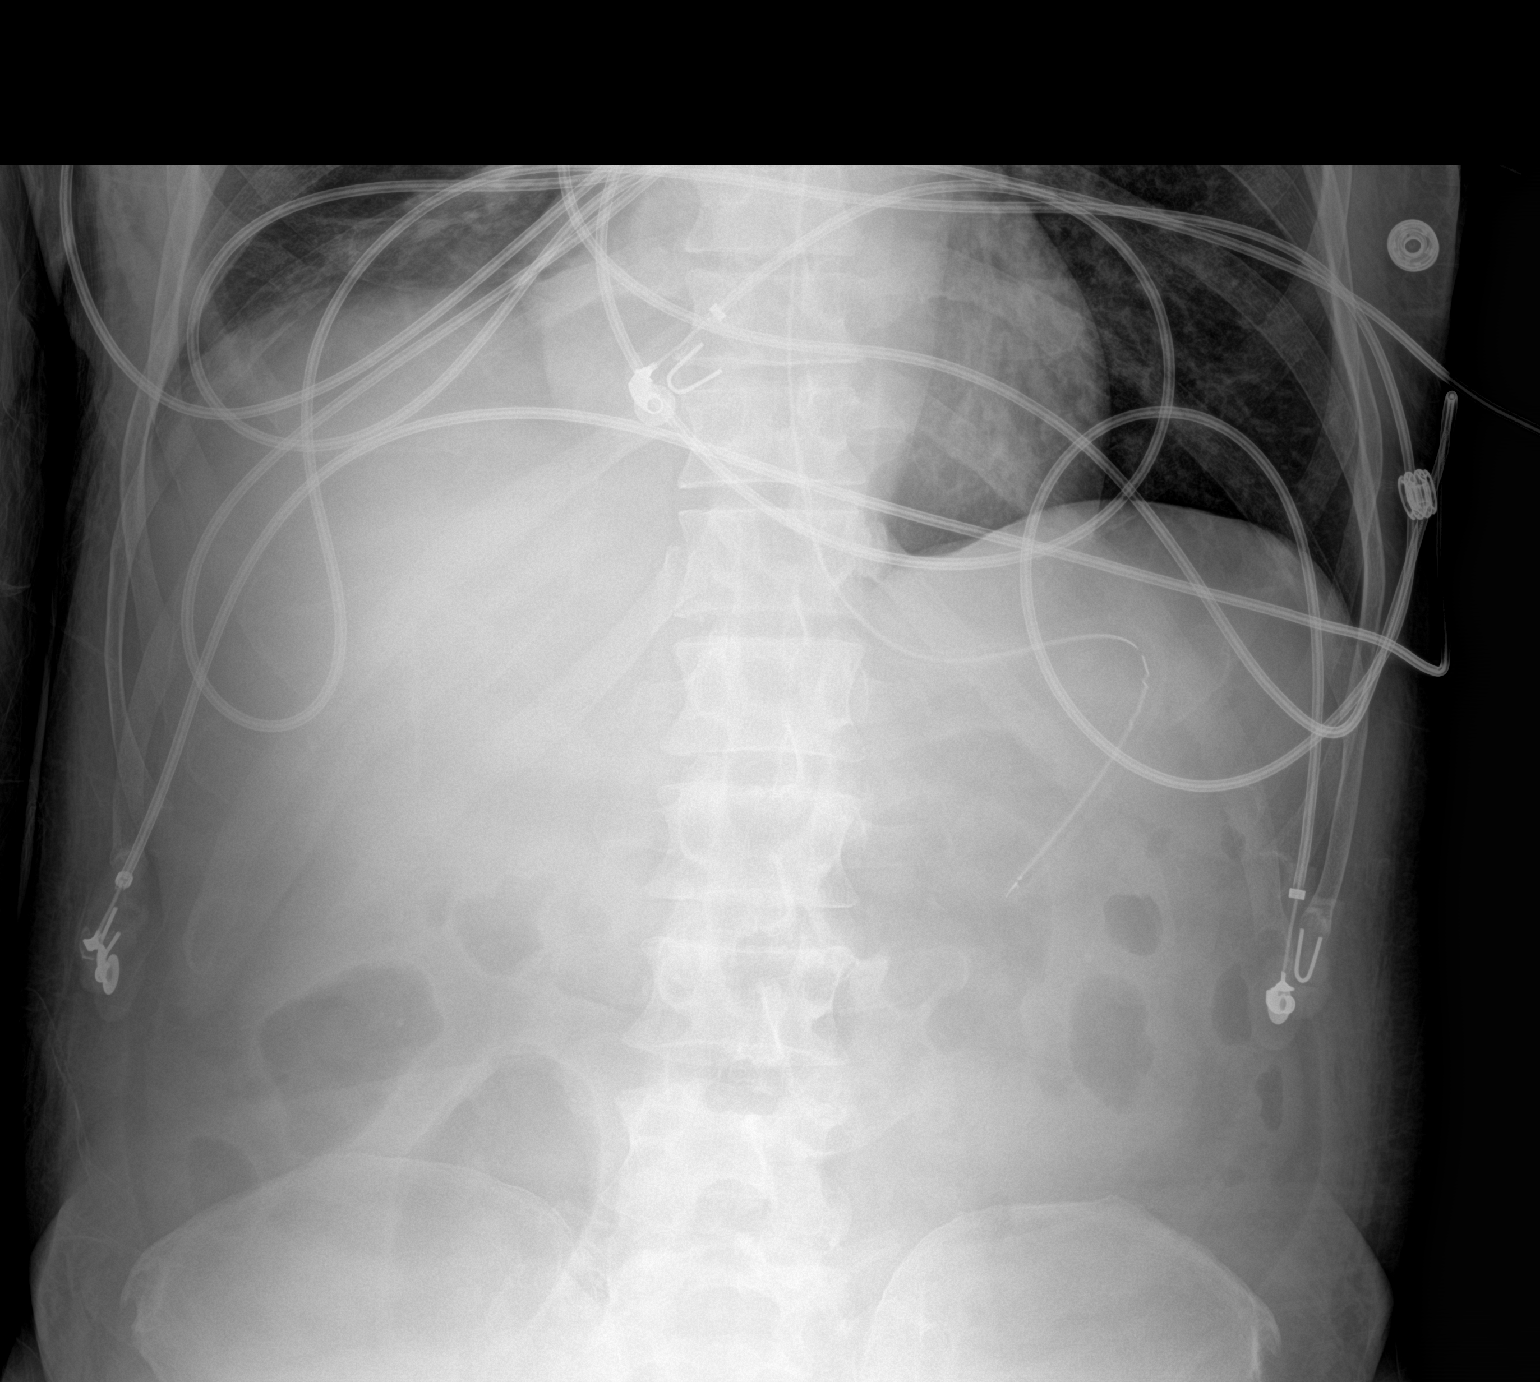

[1 of 1 positions shown; findings below may reference images not displayed]

FINDINGS: The bowel gas pattern is normal. Distal tip of orogastric tube is
seen in expected position of proximal stomach. No radio-opaque
calculi or other significant radiographic abnormality are seen.
IMPRESSION: Distal tip of orogastric tube tip is seen in expected position of
proximal stomach.

## 2020-06-01 IMAGING — DX DG CHEST 1V
1 series · 1 of 1 positions shown · non-contrast
Comparison: the previous day's study

CLINICAL DATA: S/P thoracentesis  right

EXAM:
CHEST  1 VIEW

[chest ap]
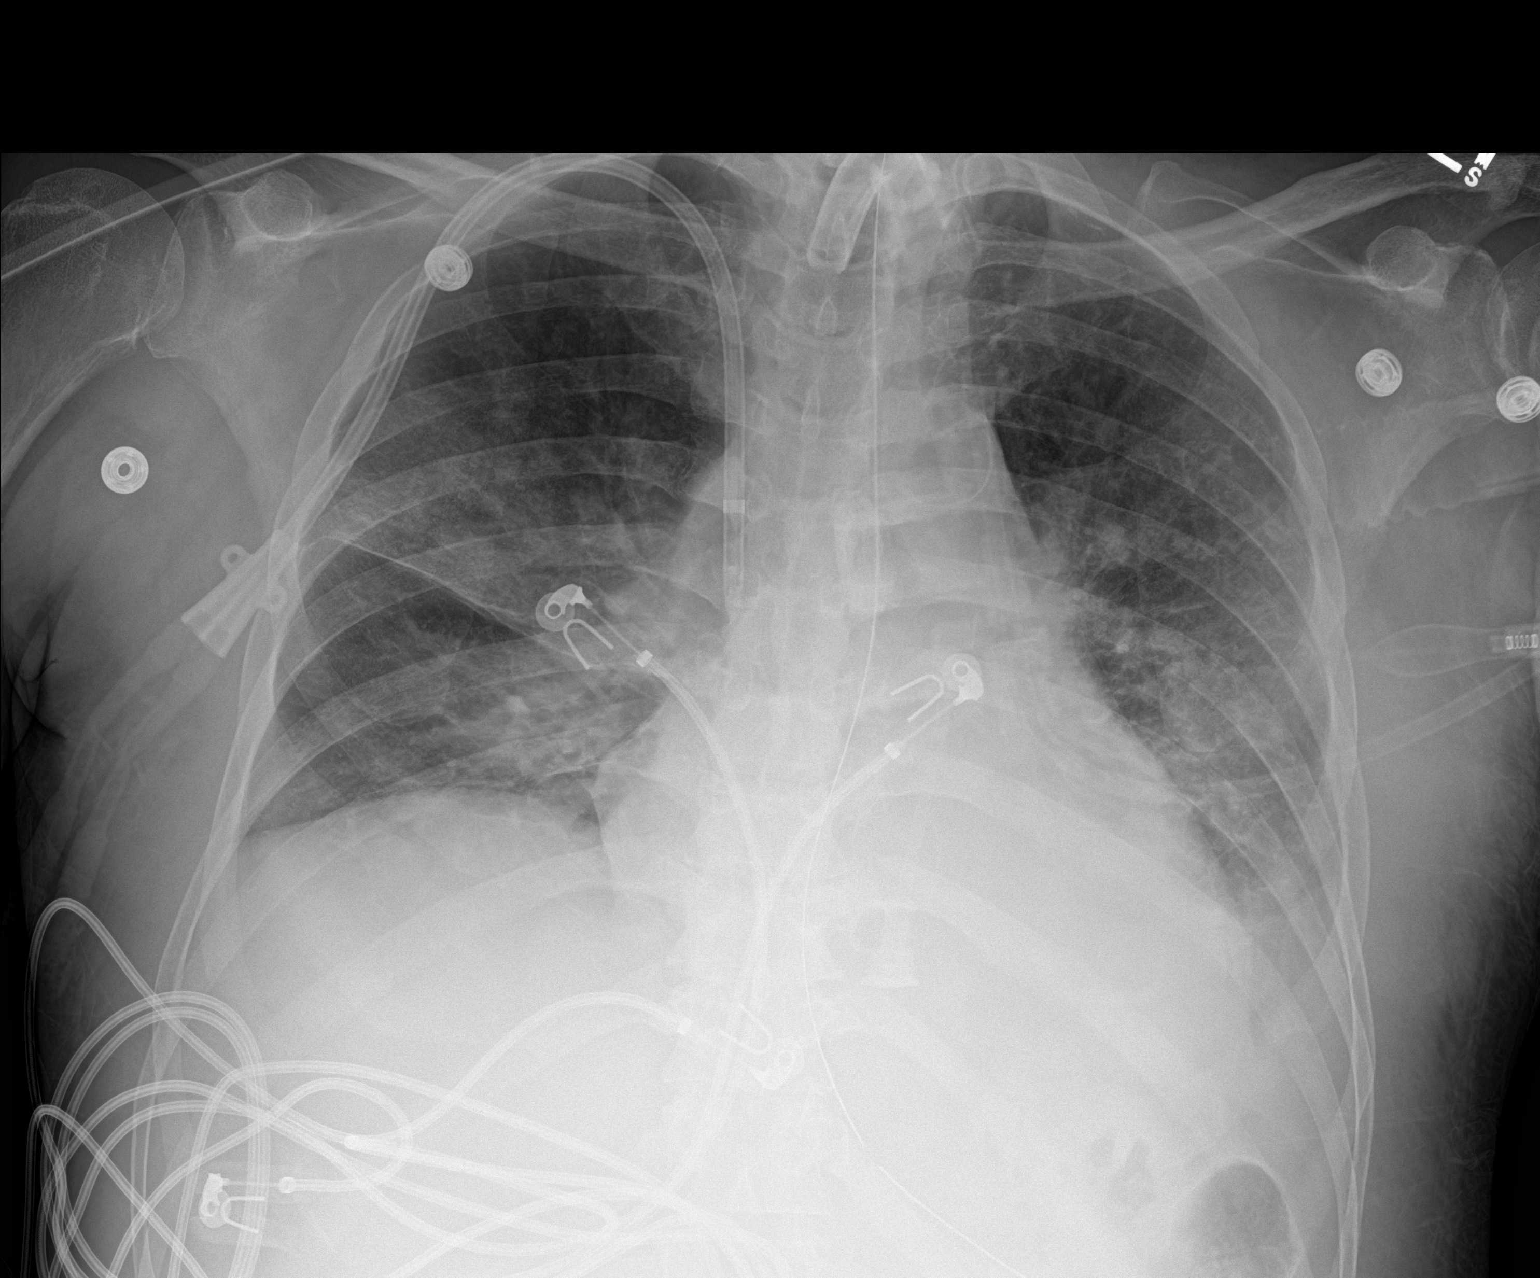

[1 of 1 positions shown; findings below may reference images not displayed]

FINDINGS: No pneumothorax. Apparent resolution of the right pleural effusion.
Persistent blunting of the left lateral costophrenic angle, possible
effusion. Patchy airspace opacities in the lung bases, slightly
improved on the right since previous study. Tracheostomy device,
enteral tube, and tunneled right IJ hemodialysis catheter stable.
Cervical fixation hardware partially visualized.
IMPRESSION: 1. No pneumothorax post right thoracentesis.
Critical Value/emergent results were sent at the time of
interpretation on 06/25/2018 at [DATE] to KIKIN DZ PA, who
electronically acknowledged these results.
2. Bibasilar airspace opacities, improved on the right since prior
study.
3.  Support hardware stable in position.
